# Patient Record
Sex: Female | Born: 1940 | Race: White | Hispanic: No | Smoking: Never smoker
Health system: Southern US, Community
[De-identification: ages and names within clinical notes are randomized; demographics above are authoritative.]

## PROBLEM LIST (undated history)

## (undated) DIAGNOSIS — F32A Depression, unspecified: Secondary | ICD-10-CM

## (undated) DIAGNOSIS — F329 Major depressive disorder, single episode, unspecified: Secondary | ICD-10-CM

## (undated) DIAGNOSIS — M797 Fibromyalgia: Secondary | ICD-10-CM

## (undated) DIAGNOSIS — M1990 Unspecified osteoarthritis, unspecified site: Secondary | ICD-10-CM

## (undated) DIAGNOSIS — C50919 Malignant neoplasm of unspecified site of unspecified female breast: Secondary | ICD-10-CM

## (undated) DIAGNOSIS — M545 Low back pain, unspecified: Secondary | ICD-10-CM

## (undated) DIAGNOSIS — G8929 Other chronic pain: Secondary | ICD-10-CM

## (undated) DIAGNOSIS — I10 Essential (primary) hypertension: Secondary | ICD-10-CM

## (undated) DIAGNOSIS — K219 Gastro-esophageal reflux disease without esophagitis: Secondary | ICD-10-CM

## (undated) DIAGNOSIS — Z8639 Personal history of other endocrine, nutritional and metabolic disease: Secondary | ICD-10-CM

## (undated) HISTORY — PX: HERNIA REPAIR: SHX51

## (undated) HISTORY — PX: FRACTURE SURGERY: SHX138

## (undated) HISTORY — PX: UMBILICAL HERNIA REPAIR: SHX196

## (undated) HISTORY — PX: TUBAL LIGATION: SHX77

## (undated) HISTORY — PX: CATARACT EXTRACTION W/ INTRAOCULAR LENS  IMPLANT, BILATERAL: SHX1307

## (undated) HISTORY — PX: DILATION AND CURETTAGE OF UTERUS: SHX78

---

## 1976-10-31 HISTORY — PX: VAGINAL HYSTERECTOMY: SUR661

## 2006-10-31 DIAGNOSIS — C50919 Malignant neoplasm of unspecified site of unspecified female breast: Secondary | ICD-10-CM

## 2006-10-31 HISTORY — DX: Malignant neoplasm of unspecified site of unspecified female breast: C50.919

## 2006-10-31 HISTORY — PX: BREAST SURGERY: SHX581

## 2006-10-31 HISTORY — PX: BREAST BIOPSY: SHX20

## 2006-10-31 HISTORY — PX: BREAST LUMPECTOMY: SHX2

## 2012-02-19 ENCOUNTER — Encounter (HOSPITAL_BASED_OUTPATIENT_CLINIC_OR_DEPARTMENT_OTHER): Payer: Self-pay | Admitting: *Deleted

## 2012-02-19 ENCOUNTER — Emergency Department (HOSPITAL_BASED_OUTPATIENT_CLINIC_OR_DEPARTMENT_OTHER)
Admission: EM | Admit: 2012-02-19 | Discharge: 2012-02-19 | Disposition: A | Discharge status: Home / Self Care | Payer: Medicare Other | Attending: Emergency Medicine | Admitting: Emergency Medicine

## 2012-02-19 ENCOUNTER — Emergency Department (HOSPITAL_BASED_OUTPATIENT_CLINIC_OR_DEPARTMENT_OTHER): Payer: Medicare Other

## 2012-02-19 ENCOUNTER — Emergency Department (INDEPENDENT_AMBULATORY_CARE_PROVIDER_SITE_OTHER): Payer: Medicare Other

## 2012-02-19 DIAGNOSIS — J45909 Unspecified asthma, uncomplicated: Secondary | ICD-10-CM

## 2012-02-19 DIAGNOSIS — I10 Essential (primary) hypertension: Secondary | ICD-10-CM | POA: Insufficient documentation

## 2012-02-19 DIAGNOSIS — R0602 Shortness of breath: Secondary | ICD-10-CM | POA: Insufficient documentation

## 2012-02-19 DIAGNOSIS — J329 Chronic sinusitis, unspecified: Secondary | ICD-10-CM

## 2012-02-19 DIAGNOSIS — R05 Cough: Secondary | ICD-10-CM

## 2012-02-19 DIAGNOSIS — R059 Cough, unspecified: Secondary | ICD-10-CM | POA: Insufficient documentation

## 2012-02-19 HISTORY — DX: Essential (primary) hypertension: I10

## 2012-02-19 MED ORDER — FLUTICASONE PROPIONATE HFA 44 MCG/ACT IN AERO
2.0000 | INHALATION_SPRAY | Freq: Two times a day (BID) | RESPIRATORY_TRACT | Status: DC
  Administered 2012-02-19: 2 via RESPIRATORY_TRACT
  Filled 2012-02-19: qty 10.6

## 2012-02-19 MED ORDER — CETIRIZINE HCL 10 MG PO TABS: ORAL_TABLET | ORAL | Status: AC

## 2012-02-19 MED ORDER — HYDROCOD POLST-CHLORPHEN POLST 10-8 MG/5ML PO LQCR: 5.0000 mL | Freq: Two times a day (BID) | ORAL | Status: DC | PRN

## 2012-02-19 NOTE — ED Provider Notes (Signed)
History   This chart was scribed for Hanley Seamen, MD scribed by Magnus Sinning. The patient was seen in room MH12/MH12 seen at 23:17    CSN: [id number]  Arrival date & time 02/19/12  2033   First MD Initiated Contact with Patient 02/19/12 2303      Chief Complaint  Patient presents with  . Cough    (Consider location/radiation/quality/duration/timing/severity/associated sxs/prior treatment) HPI Lindsey Lowe is a 71 y.o. female who presents to the Emergency Department complaining of constant moderate cough with associated cough induced CP, SOB, and head congestion, onset 3 weeks. Says she is not currently taking anything for allergies. Denies any aggravating or modifying factors.  Past Medical History  Diagnosis Date  . Hypertension     History reviewed. No pertinent past surgical history.  No family history on file.  History  Substance Use Topics  . Smoking status: Never Smoker   . Smokeless tobacco: Not on file  . Alcohol Use:    Review of Systems 10 Systems reviewed and are negative for acute change except as noted in the HPI. Allergies  Codeine; Gluten; Prednisone; and Wheat  Home Medications   Current Outpatient Rx  Name Route Sig Dispense Refill  . ALBUTEROL SULFATE HFA 108 (90 BASE) MCG/ACT IN AERS Inhalation Inhale 2 puffs into the lungs every 6 (six) hours as needed. For shortness of breath or wheezing    . GABAPENTIN 300 MG PO CAPS Oral Take 600 mg by mouth 2 (two) times daily.    Marland Kitchen LETROZOLE 2.5 MG PO TABS Oral Take 2.5 mg by mouth daily.    . ADULT MULTIVITAMIN W/MINERALS CH Oral Take 1 tablet by mouth daily.    Marland Kitchen NAPROXEN SODIUM 220 MG PO TABS Oral Take 220 mg by mouth 2 (two) times daily as needed. For pain    . PANTOPRAZOLE SODIUM 40 MG PO TBEC Oral Take 40 mg by mouth daily.    . TRIAMTERENE-HCTZ 37.5-25 MG PO TABS Oral Take 1 tablet by mouth daily.    Marland Kitchen CETIRIZINE HCL 10 MG PO TABS  Take 1 daily at bedtime as needed for allergies.    Marland Kitchen HYDROCOD  POLST-CPM POLST ER 10-8 MG/5ML PO LQCR Oral Take 5 mLs by mouth every 12 (twelve) hours as needed (cough). 115 mL 0    Physical Exam BP 184/88  Pulse 93  Temp(Src) 97.7 F (36.5 C) (Oral)  Resp 18  Ht 5' (1.524 m)  Wt 145 lb (65.772 kg)  BMI 28.32 kg/m2  SpO2 99%  General Appearance:    Alert, cooperative, no distress, appears stated age  Head:    Normocephalic, without obvious abnormality, atraumatic  Eyes:    PERRL, conjunctiva/corneas clear, EOM's intact, fundi    benign, both eyes  Ears:    Normal TM's and external ear canals, both ears  Nose:   Nares normal, septum midline, mucosa normal, no drainage    or sinus tenderness  Throat:   Lips, mucosa, and tongue normal; teeth and gums normal  Neck:   Supple, symmetrical, trachea midline, no adenopathy;    thyroid:  no enlargement/tenderness/nodules; no carotid   bruit or JVD  Back:     Symmetric, no curvature, ROM normal, no CVA tenderness  Lungs:     Clear to auscultation bilaterally, respirations unlabored.Mildly decreased air movement. No wheezing. Dry cough. Mild bilateral parasternal tenderness   Chest Wall:    No tenderness or deformity.    Heart:    Regular rate and rhythm,  S1 and S2 normal, no murmur, rub   or gallop  Breast Exam:     Abdomen:     Soft, non-tender, bowel sounds active all four quadrants,    no masses, no organomegaly  Genitalia:    Rectal:    Extremities:   Extremities normal, atraumatic, no cyanosis. Trace edema of lower legs.  Pulses:   2+ and symmetric all extremities  Skin:   Skin color, texture, turgor normal, no rashes or lesions  Lymph nodes:   Cervical, supraclavicular, and axillary nodes normal  Neurologic:   CNII-XII intact, normal strength, sensation and reflexes    throughout    ED Course  Procedures (including critical care time) DIAGNOSTIC STUDIES: Oxygen Saturation is 99% on room air, normal by my interpretation.    COORDINATION OF CARE: Dg Chest 2 View  02/19/2012  *RADIOLOGY  REPORT*  Clinical Data: Cough for 3 weeks.  Shortness of breath.  Sinus infection.  CHEST - 2 VIEW  Comparison: None.  Findings: Borderline heart size with normal pulmonary vascularity. Tortuous and calcified aorta.  Mild peribronchial thickening and interstitial change suggesting chronic bronchitis.  No focal airspace consolidation in the lungs.  No blunting of costophrenic angles.  No pneumothorax.  Degenerative changes in the thoracic spine.  IMPRESSION: Chronic bronchitic changes.  No focal consolidation.  Original Report Authenticated By: Marlon Pel, M.D.     1. Allergic bronchitis       MDM  The patient has an albuterol inhaler but does not have a spacer device. We will have our respiratory therapist provide a space device instructor at its proper use. We'll also start her on steroid inhaler  I personally performed the services described in this documentation, which was scribed in my presence.  The recorded information has been reviewed and considered.         Hanley Seamen, MD 02/19/12 2330

## 2012-02-19 NOTE — Patient Instructions (Signed)
Pt instructed on the proper use of administering flovent mdi via aerochamber. Pt tolerated well

## 2012-02-19 NOTE — ED Notes (Signed)
Coughing for appx 3 weeks. Went to the dr three weeks ago and got abx for a sinus infection. Now has continuing cough and feels SOB.

## 2012-02-20 NOTE — ED Notes (Signed)
[facility] pharmacy called requesting quantity for zyrtec prescription. PA Lawyer approved #[id number].

## 2014-04-09 ENCOUNTER — Emergency Department
Admission: EM | Admit: 2014-04-09 | Discharge: 2014-04-09 | Disposition: A | Discharge status: Home / Self Care | Payer: 59 | Source: Home / Self Care

## 2014-04-09 ENCOUNTER — Encounter: Payer: Self-pay | Admitting: Emergency Medicine

## 2014-04-09 DIAGNOSIS — B9789 Other viral agents as the cause of diseases classified elsewhere: Principal | ICD-10-CM

## 2014-04-09 DIAGNOSIS — J069 Acute upper respiratory infection, unspecified: Secondary | ICD-10-CM

## 2014-04-09 MED ORDER — BENZONATATE 200 MG PO CAPS: 200.0000 mg | ORAL_CAPSULE | Freq: Every day | ORAL | Status: DC

## 2014-04-09 MED ORDER — AMOXICILLIN 875 MG PO TABS: 875.0000 mg | ORAL_TABLET | Freq: Two times a day (BID) | ORAL | Status: DC

## 2014-04-09 NOTE — Discharge Instructions (Signed)
Take plain Mucinex (1200 mg guaifenesin) twice daily for cough and congestion.  Increase fluid intake, rest. May use Afrin nasal spray (or generic oxymetazoline) twice daily for about 5 days.  Also recommend using saline nasal spray several times daily and saline nasal irrigation (AYR is a common brand).  Continue Flonase nasal spray. Try warm salt water gargles for sore throat.  Stop all antihistamines for now, and other non-prescription cough/cold preparations. May take Ibuprofen 200mg , 4 tabs every 8 hours with food for chest/sternum discomfort. Begin amoxicillin if not improving about 5 days or if persistent fever develops  Follow-up with family doctor if not improving in about 7 to10 days.

## 2014-04-09 NOTE — ED Provider Notes (Signed)
CSN: [id number]     Arrival date & time 04/09/14  1522 History   None    Chief Complaint  Patient presents with  . Cough      HPI Comments: Patient developed onset of URI symptoms one week ago with sinus congestion.  She started taking Allegra at that time without improvement.  Two days ago she developed sore throat, productive cough, fatigue, myalgias, and sweats.  She complains of tightness in her anterior chest.  The history is provided by the patient.    Past Medical History  Diagnosis Date  . Hypertension    History reviewed. No pertinent past surgical history. No family history on file. History  Substance Use Topics  . Smoking status: Never Smoker   . Smokeless tobacco: Not on file  . Alcohol Use: No   OB History   Grav Para Term Preterm Abortions TAB SAB Ect Mult Living                 Review of Systems + sore throat + cough No pleuritic pain, but has tightness in anterior chest ? wheezing + nasal congestion + post-nasal drainage ? sinus pain/pressure No itchy/red eyes No earache No hemoptysis No SOB No fever, + chills/sweats No nausea No vomiting No abdominal pain No diarrhea No urinary symptoms No skin rash + fatigue + myalgias No headache Used OTC meds without relief  Allergies  Codeine; Gluten; Prednisone; and Wheat  Home Medications   Prior to Admission medications   Medication Sig Start Date End Date Taking? Authorizing Provider  albuterol (PROVENTIL HFA;VENTOLIN HFA) 108 (90 BASE) MCG/ACT inhaler Inhale 2 puffs into the lungs every 6 (six) hours as needed. For shortness of breath or wheezing    Historical Provider, MD  amoxicillin (AMOXIL) 875 MG tablet Take 1 tablet (875 mg total) by mouth 2 (two) times daily. (Rx void after 04/16/14) 04/09/14   [name], MD  benzonatate (TESSALON) 200 MG capsule Take 1 capsule (200 mg total) by mouth at bedtime. Take as needed for cough 04/09/14   [name], MD  cetirizine (ZYRTEC) 10 MG tablet  Take 1 daily at bedtime as needed for allergies. 02/19/12   John L Molpus, MD  gabapentin (NEURONTIN) 300 MG capsule Take 600 mg by mouth 2 (two) times daily.    Historical Provider, MD  letrozole (FEMARA) 2.5 MG tablet Take 2.5 mg by mouth daily.    Historical Provider, MD  Multiple Vitamin (MULITIVITAMIN WITH MINERALS) TABS Take 1 tablet by mouth daily.    Historical Provider, MD  naproxen sodium (ANAPROX) 220 MG tablet Take 220 mg by mouth 2 (two) times daily as needed. For pain    Historical Provider, MD  pantoprazole (PROTONIX) 40 MG tablet Take 40 mg by mouth daily.    Historical Provider, MD  triamterene-hydrochlorothiazide (MAXZIDE-25) 37.5-25 MG per tablet Take 1 tablet by mouth daily.    Historical Provider, MD   Pulse 84  Temp(Src) 97.9 F (36.6 C) (Oral)  Ht 5' (1.524 m)  Wt 152 lb (68.947 kg)  BMI 29.69 kg/m2  SpO2 98% Physical Exam Nursing notes and Vital Signs reviewed. Appearance:  Patient appears healthy, stated age, and in no acute distress Eyes:  Pupils are equal, round, and reactive to light and accomodation.  Extraocular movement is intact.  Conjunctivae are not inflamed  Ears:  Canals normal.  Tympanic membranes normal.  Nose:  Mildly congested turbinates.  No sinus tenderness.   Pharynx:  Normal Neck:  Supple.  Tender enlarged posterior nodes are palpated bilaterally  Lungs:  Clear to auscultation.  Breath sounds are equal.  Chest:  Distinct tenderness to palpation over the mid-sternum.  Heart:  Regular rate and rhythm without murmurs, rubs, or gallops.  Abdomen:  Nontender without masses or hepatosplenomegaly.  Bowel sounds are present.  No CVA or flank tenderness.  Extremities:  No edema.  No calf tenderness Skin:  No rash present.   ED Course  Procedures  none      MDM   1. Viral URI with cough    There is no evidence of bacterial infection today.   Treat symptomatically for now:  Prescription written for Benzonatate [facility]) to take at bedtime for  night-time cough.  Take plain Mucinex (1200 mg guaifenesin) twice daily for cough and congestion.  Increase fluid intake, rest. May use Afrin nasal spray (or generic oxymetazoline) twice daily for about 5 days.  Also recommend using saline nasal spray several times daily and saline nasal irrigation (AYR is a common brand).  Continue Flonase nasal spray. Try warm salt water gargles for sore throat.  Stop all antihistamines for now, and other non-prescription cough/cold preparations. May take Ibuprofen 200mg , 4 tabs every 8 hours with food for chest/sternum discomfort. Begin amoxicillin if not improving about 5 days or if persistent fever develops (Given a prescription to hold, with an expiration date)  Follow-up with family doctor if not improving in about 7 to10 days.    [name], MD 04/09/14 [phone]

## 2014-04-09 NOTE — ED Notes (Signed)
Productive cough, congestion, dark yellow drainage x 1 week worse last three days

## 2014-04-12 ENCOUNTER — Telehealth: Payer: Self-pay | Admitting: *Deleted

## 2014-10-31 HISTORY — PX: NASAL SINUS SURGERY: SHX719

## 2015-11-01 HISTORY — PX: NASAL FRACTURE SURGERY: SHX718

## 2016-11-01 ENCOUNTER — Encounter (HOSPITAL_BASED_OUTPATIENT_CLINIC_OR_DEPARTMENT_OTHER): Payer: Self-pay | Admitting: *Deleted

## 2016-11-01 ENCOUNTER — Emergency Department (HOSPITAL_BASED_OUTPATIENT_CLINIC_OR_DEPARTMENT_OTHER): Payer: Medicare HMO

## 2016-11-01 ENCOUNTER — Observation Stay (HOSPITAL_BASED_OUTPATIENT_CLINIC_OR_DEPARTMENT_OTHER)
Admission: EM | Admit: 2016-11-01 | Discharge: 2016-11-02 | Disposition: A | Discharge status: Home / Self Care | Payer: Medicare HMO | Attending: Internal Medicine | Admitting: Internal Medicine

## 2016-11-01 DIAGNOSIS — Z853 Personal history of malignant neoplasm of breast: Secondary | ICD-10-CM | POA: Diagnosis not present

## 2016-11-01 DIAGNOSIS — Z79899 Other long term (current) drug therapy: Secondary | ICD-10-CM | POA: Diagnosis not present

## 2016-11-01 DIAGNOSIS — B9789 Other viral agents as the cause of diseases classified elsewhere: Secondary | ICD-10-CM | POA: Insufficient documentation

## 2016-11-01 DIAGNOSIS — J102 Influenza due to other identified influenza virus with gastrointestinal manifestations: Secondary | ICD-10-CM | POA: Diagnosis not present

## 2016-11-01 DIAGNOSIS — R0902 Hypoxemia: Secondary | ICD-10-CM

## 2016-11-01 DIAGNOSIS — Z79811 Long term (current) use of aromatase inhibitors: Secondary | ICD-10-CM | POA: Insufficient documentation

## 2016-11-01 DIAGNOSIS — I10 Essential (primary) hypertension: Secondary | ICD-10-CM | POA: Insufficient documentation

## 2016-11-01 DIAGNOSIS — R05 Cough: Secondary | ICD-10-CM | POA: Diagnosis present

## 2016-11-01 DIAGNOSIS — M797 Fibromyalgia: Secondary | ICD-10-CM | POA: Diagnosis not present

## 2016-11-01 DIAGNOSIS — F329 Major depressive disorder, single episode, unspecified: Secondary | ICD-10-CM | POA: Insufficient documentation

## 2016-11-01 DIAGNOSIS — K219 Gastro-esophageal reflux disease without esophagitis: Secondary | ICD-10-CM | POA: Insufficient documentation

## 2016-11-01 DIAGNOSIS — E876 Hypokalemia: Secondary | ICD-10-CM | POA: Diagnosis not present

## 2016-11-01 DIAGNOSIS — R059 Cough, unspecified: Secondary | ICD-10-CM

## 2016-11-01 DIAGNOSIS — N390 Urinary tract infection, site not specified: Secondary | ICD-10-CM | POA: Diagnosis not present

## 2016-11-01 DIAGNOSIS — K900 Celiac disease: Secondary | ICD-10-CM | POA: Diagnosis not present

## 2016-11-01 DIAGNOSIS — R6510 Systemic inflammatory response syndrome (SIRS) of non-infectious origin without acute organ dysfunction: Secondary | ICD-10-CM | POA: Diagnosis not present

## 2016-11-01 DIAGNOSIS — J101 Influenza due to other identified influenza virus with other respiratory manifestations: Secondary | ICD-10-CM | POA: Diagnosis present

## 2016-11-01 HISTORY — DX: Low back pain: M54.5

## 2016-11-01 HISTORY — DX: Personal history of other endocrine, nutritional and metabolic disease: Z86.39

## 2016-11-01 HISTORY — DX: Depression, unspecified: F32.A

## 2016-11-01 HISTORY — DX: Unspecified osteoarthritis, unspecified site: M19.90

## 2016-11-01 HISTORY — DX: Fibromyalgia: M79.7

## 2016-11-01 HISTORY — DX: Gastro-esophageal reflux disease without esophagitis: K21.9

## 2016-11-01 HISTORY — DX: Other chronic pain: G89.29

## 2016-11-01 HISTORY — DX: Low back pain, unspecified: M54.50

## 2016-11-01 HISTORY — DX: Major depressive disorder, single episode, unspecified: F32.9

## 2016-11-01 HISTORY — DX: Malignant neoplasm of unspecified site of unspecified female breast: C50.919

## 2016-11-01 LAB — COMPREHENSIVE METABOLIC PANEL
ALK PHOS: 70 U/L (ref 38–126)
ALT: 15 U/L (ref 14–54)
AST: 24 U/L (ref 15–41)
Albumin: 4 g/dL (ref 3.5–5.0)
Anion gap: 10 (ref 5–15)
BILIRUBIN TOTAL: 0.6 mg/dL (ref 0.3–1.2)
BUN: 10 mg/dL (ref 6–20)
CALCIUM: 8.9 mg/dL (ref 8.9–10.3)
CO2: 26 mmol/L (ref 22–32)
CREATININE: 0.89 mg/dL (ref 0.44–1.00)
Chloride: 96 mmol/L — ABNORMAL LOW (ref 101–111)
Glucose, Bld: 108 mg/dL — ABNORMAL HIGH (ref 65–99)
Potassium: 3.3 mmol/L — ABNORMAL LOW (ref 3.5–5.1)
Sodium: 132 mmol/L — ABNORMAL LOW (ref 135–145)
TOTAL PROTEIN: 7.6 g/dL (ref 6.5–8.1)

## 2016-11-01 LAB — CBC WITH DIFFERENTIAL/PLATELET
BASOS PCT: 0 %
Basophils Absolute: 0 10*3/uL (ref 0.0–0.1)
EOS ABS: 0 10*3/uL (ref 0.0–0.7)
EOS PCT: 0 %
HCT: 40 % (ref 36.0–46.0)
Hemoglobin: 12.8 g/dL (ref 12.0–15.0)
Lymphocytes Relative: 9 %
Lymphs Abs: 0.5 10*3/uL — ABNORMAL LOW (ref 0.7–4.0)
MCH: 28.4 pg (ref 26.0–34.0)
MCHC: 32 g/dL (ref 30.0–36.0)
MCV: 88.7 fL (ref 78.0–100.0)
Monocytes Absolute: 0.6 10*3/uL (ref 0.1–1.0)
Monocytes Relative: 11 %
Neutro Abs: 4 10*3/uL (ref 1.7–7.7)
Neutrophils Relative %: 80 %
PLATELETS: 303 10*3/uL (ref 150–400)
RBC: 4.51 MIL/uL (ref 3.87–5.11)
RDW: 13.8 % (ref 11.5–15.5)
WBC: 5 10*3/uL (ref 4.0–10.5)

## 2016-11-01 LAB — URINALYSIS, ROUTINE W REFLEX MICROSCOPIC
Bilirubin Urine: NEGATIVE
Glucose, UA: NEGATIVE mg/dL
HGB URINE DIPSTICK: NEGATIVE
KETONES UR: NEGATIVE mg/dL
Nitrite: POSITIVE — AB
PROTEIN: NEGATIVE mg/dL
Specific Gravity, Urine: 1.013 (ref 1.005–1.030)
pH: 7 (ref 5.0–8.0)

## 2016-11-01 LAB — URINALYSIS, MICROSCOPIC (REFLEX)

## 2016-11-01 LAB — I-STAT CG4 LACTIC ACID, ED: LACTIC ACID, VENOUS: 0.93 mmol/L (ref 0.5–1.9)

## 2016-11-01 MED ORDER — DEXTROSE 5 % IV SOLN
1.0000 g | Freq: Once | INTRAVENOUS | Status: AC
  Administered 2016-11-01: 1 g via INTRAVENOUS
  Filled 2016-11-01: qty 10

## 2016-11-01 MED ORDER — ACETAMINOPHEN 325 MG PO TABS
650.0000 mg | ORAL_TABLET | Freq: Once | ORAL | Status: AC
  Administered 2016-11-01: 650 mg via ORAL
  Filled 2016-11-01: qty 2

## 2016-11-01 MED ORDER — SODIUM CHLORIDE 0.9 % IV BOLUS (SEPSIS)
1000.0000 mL | Freq: Once | INTRAVENOUS | Status: AC
  Administered 2016-11-01: 1000 mL via INTRAVENOUS

## 2016-11-01 NOTE — ED Notes (Signed)
Talked to EDP prior to starting abx about possibility of needing blood cultures. EDP did not believe that cultures are needed for this pt.

## 2016-11-01 NOTE — ED Provider Notes (Signed)
[facility] DEPT [facility] Provider Note   CSN: [id number] Arrival date & time: 11/01/16  [id number]   By signing my name below, I, Lindsey Lowe, attest that this documentation has been prepared under the direction and in the presence of [name] Julio Alm, MD  Electronically Signed: [name], ED Scribe. 11/01/16. 7:13 PM.   History   Chief Complaint Chief Complaint  Patient presents with  . Cough   The history is provided by the patient. No language interpreter was used.   HPI Comments:  Lindsey Lowe is a 76 y.o. female, with a hx of celiac disease, who presents to the Emergency Department complaining of a sudden onset, persistent cough which began in the PM yesterday. Pt also reports subjective fever, congestion and a sore throat. Pt states she experienced LOC and fell 3 days ago. Relative reports when the pt came to she did not know where she was, crawled to the bathroom, vomited and had diarrhea. No alleviating factors noted. Pt denies any other associated symptoms and any other modifying factors at this time.   Past Medical History:  Diagnosis Date  . Cancer ([place])   . Hypertension     There are no active problems to display for this patient.   Past Surgical History:  Procedure Laterality Date  . ABDOMINAL HYSTERECTOMY    . BREAST SURGERY    . EYE SURGERY    . HERNIA REPAIR    . NASAL SINUS SURGERY      OB History    No data available       Home Medications    Prior to Admission medications   Medication Sig Start Date End Date Taking? Authorizing Provider  albuterol (PROVENTIL HFA;VENTOLIN HFA) 108 (90 BASE) MCG/ACT inhaler Inhale 2 puffs into the lungs every 6 (six) hours as needed. For shortness of breath or wheezing   Yes Historical Provider, MD  gabapentin (NEURONTIN) 300 MG capsule Take 600 mg by mouth 2 (two) times daily.   Yes Historical Provider, MD  letrozole (FEMARA) 2.5 MG tablet Take 2.5 mg by mouth daily.   Yes Historical Provider, MD  Multiple  Vitamin (MULITIVITAMIN WITH MINERALS) TABS Take 1 tablet by mouth daily.   Yes Historical Provider, MD  pantoprazole (PROTONIX) 40 MG tablet Take 40 mg by mouth daily.   Yes Historical Provider, MD  triamterene-hydrochlorothiazide (MAXZIDE-25) 37.5-25 MG per tablet Take 1 tablet by mouth daily.   Yes Historical Provider, MD  amoxicillin (AMOXIL) 875 MG tablet Take 1 tablet (875 mg total) by mouth 2 (two) times daily. (Rx void after 04/16/14) 04/09/14   [name], MD  benzonatate (TESSALON) 200 MG capsule Take 1 capsule (200 mg total) by mouth at bedtime. Take as needed for cough 04/09/14   [name], MD  cetirizine (ZYRTEC) 10 MG tablet Take 1 daily at bedtime as needed for allergies. 02/19/12   [name], MD  naproxen sodium (ANAPROX) 220 MG tablet Take 220 mg by mouth 2 (two) times daily as needed. For pain    Historical Provider, MD    Family History No family history on file.  Social History Social History  Substance Use Topics  . Smoking status: Never Smoker  . Smokeless tobacco: Never Used  . Alcohol use No     Allergies   Codeine; Gluten; Prednisone; and Wheat   Review of Systems Review of Systems  HENT: Positive for congestion and sore throat.   Respiratory: Positive for cough.   Gastrointestinal: Positive for diarrhea and  vomiting.  Neurological: Positive for syncope.  All other systems reviewed and are negative.  Physical Exam Updated Vital Signs BP 142/68   Pulse 91   Temp 98.4 F (36.9 C) (Oral)   Resp 16   Ht 4\' 11"  (1.499 m)   Wt 152 lb (68.9 kg)   SpO2 94%   BMI 30.70 kg/m   Physical Exam  Constitutional: She is oriented to person, place, and time. She appears well-developed and well-nourished. No distress.  HENT:  Head: Normocephalic and atraumatic.  Eyes: Conjunctivae are normal.  Cardiovascular: Tachycardia present.   Pulmonary/Chest: Effort normal. Tachypnea (mildly ) noted. She has no wheezes.  Abdominal: She exhibits no distension.    Neurological: She is alert and oriented to person, place, and time.  Skin: Skin is warm and dry.  Psychiatric: She has a normal mood and affect.  Nursing note and vitals reviewed.  ED Treatments / Results  DIAGNOSTIC STUDIES:  Oxygen Saturation is 100% on Ra, normal by my interpretation.    COORDINATION OF CARE:  7:12 PM Strep screen and lab work. Discussed treatment plan with pt at bedside and pt agreed to plan.  Labs (all labs ordered are listed, but only abnormal results are displayed) Labs Reviewed  COMPREHENSIVE METABOLIC PANEL - Abnormal; Notable for the following:       Result Value   Sodium 132 (*)    Potassium 3.3 (*)    Chloride 96 (*)    Glucose, Bld 108 (*)    All other components within normal limits  CBC WITH DIFFERENTIAL/PLATELET - Abnormal; Notable for the following:    Lymphs Abs 0.5 (*)    All other components within normal limits  URINALYSIS, ROUTINE W REFLEX MICROSCOPIC - Abnormal; Notable for the following:    APPearance CLOUDY (*)    Nitrite POSITIVE (*)    Leukocytes, UA MODERATE (*)    All other components within normal limits  URINALYSIS, MICROSCOPIC (REFLEX) - Abnormal; Notable for the following:    Bacteria, UA MANY (*)    Squamous Epithelial / LPF 0-5 (*)    All other components within normal limits  URINE CULTURE  I-STAT CG4 LACTIC ACID, ED    EKG  EKG Interpretation  Date/Time:  Tuesday November 01 2016 20:14:55 EST Ventricular Rate:  91 PR Interval:    QRS Duration: 105 QT Interval:  380 QTC Calculation: 468 R Axis:   25 Text Interpretation:  Sinus rhythm Left ventricular hypertrophy Baseline wander in lead(s) V6 Normal sinus rhythm Confirmed by [name] ([id number]) on 11/01/2016 9:00:13 PM       Radiology Dg Chest 2 View  Result Date: 11/01/2016 CLINICAL DATA:  76 year old female with cough for 4 days. Initial encounter. EXAM: CHEST  2 VIEW COMPARISON:  [facility] 07/20/2015 FINDINGS: Stable lung volumes. Mediastinal  contours remain within normal limits. No pneumothorax, pulmonary edema, pleural effusion or confluent pulmonary opacity. Stable mild eventration of the diaphragm. Osteopenia with mildly exaggerated thoracic kyphosis. Stable visualized osseous structures. Visible bowel gas pattern is within normal limits. IMPRESSION: No acute cardiopulmonary abnormality. Electronically Signed   By: Genevie Ann M.D.   On: 11/01/2016 16:25    Procedures Procedures (including critical care time)  Medications Ordered in ED Medications  cefTRIAXone (ROCEPHIN) 1 g in dextrose 5 % 50 mL IVPB (not administered)  sodium chloride 0.9 % bolus 1,000 mL (1,000 mLs Intravenous New Bag/Given 11/01/16 2009)  acetaminophen (TYLENOL) tablet 650 mg (650 mg Oral Given 11/01/16 1955)  Initial Impression / Assessment and Plan / ED Course  I have reviewed the triage vital signs and the nursing notes.  Pertinent labs & imaging results that were available during my care of the patient were reviewed by me and considered in my medical decision making (see chart for details).  Clinical Course     Patient is a very pleasant 76 year old female presenting with cough. Patient has been feeling unwell for several days. Patient has syncopal event on Saturday with nausea and vomiting and diarrhea. Patient's reports unable to take much by mouth since then. Today started with worsening cough. Chest x-ray here negative. However patient mildly tachycardic,and febrile. Concern for flu.  We'll give fluids, get labs, UA.  Pt may require admission given vital sign bnormalities and age.  Patient requiring oxygen to keep her oxygen saturation above 90. Patient additionallty has UTI. We'll give ceftriaxone. We'll run flu.    Final Clinical Impressions(s) / ED Diagnoses   Final diagnoses:  None    New Prescriptions New Prescriptions   No medications on file  I personally performed the services described in this documentation, which was scribed  in my presence. The recorded information has been reviewed and is accurate.      Siyon Linck Julio Alm, MD 11/01/16 2101

## 2016-11-01 NOTE — Progress Notes (Signed)
Accepted to [facility] med-surg bed under obs status from [facility] for fevers, malaise, and hypoxia suspected secondary to viral respiratory illness. Oxygenating adequately and comfortable with 2 Lpm via nasal canula, but desats without. Given Rocephin for UA suggestive of infection. Flu swab sent.

## 2016-11-02 DIAGNOSIS — R6510 Systemic inflammatory response syndrome (SIRS) of non-infectious origin without acute organ dysfunction: Secondary | ICD-10-CM | POA: Diagnosis not present

## 2016-11-02 DIAGNOSIS — J101 Influenza due to other identified influenza virus with other respiratory manifestations: Secondary | ICD-10-CM | POA: Diagnosis not present

## 2016-11-02 DIAGNOSIS — E876 Hypokalemia: Secondary | ICD-10-CM | POA: Diagnosis present

## 2016-11-02 DIAGNOSIS — N390 Urinary tract infection, site not specified: Secondary | ICD-10-CM | POA: Diagnosis present

## 2016-11-02 DIAGNOSIS — N3000 Acute cystitis without hematuria: Secondary | ICD-10-CM

## 2016-11-02 DIAGNOSIS — K900 Celiac disease: Secondary | ICD-10-CM

## 2016-11-02 LAB — RESPIRATORY PANEL BY PCR
Adenovirus: NOT DETECTED
BORDETELLA PERTUSSIS-RVPCR: NOT DETECTED
CHLAMYDOPHILA PNEUMONIAE-RVPPCR: NOT DETECTED
CORONAVIRUS HKU1-RVPPCR: NOT DETECTED
Coronavirus 229E: NOT DETECTED
Coronavirus NL63: NOT DETECTED
Coronavirus OC43: NOT DETECTED
INFLUENZA A H3-RVPPCR: DETECTED — AB
INFLUENZA B-RVPPCR: NOT DETECTED
METAPNEUMOVIRUS-RVPPCR: NOT DETECTED
Mycoplasma pneumoniae: NOT DETECTED
PARAINFLUENZA VIRUS 2-RVPPCR: NOT DETECTED
PARAINFLUENZA VIRUS 3-RVPPCR: NOT DETECTED
PARAINFLUENZA VIRUS 4-RVPPCR: NOT DETECTED
Parainfluenza Virus 1: NOT DETECTED
RESPIRATORY SYNCYTIAL VIRUS-RVPPCR: NOT DETECTED
RHINOVIRUS / ENTEROVIRUS - RVPPCR: NOT DETECTED

## 2016-11-02 LAB — CBC
HEMATOCRIT: 37.3 % (ref 36.0–46.0)
Hemoglobin: 12.2 g/dL (ref 12.0–15.0)
MCH: 28.4 pg (ref 26.0–34.0)
MCHC: 32.7 g/dL (ref 30.0–36.0)
MCV: 86.9 fL (ref 78.0–100.0)
Platelets: 274 10*3/uL (ref 150–400)
RBC: 4.29 MIL/uL (ref 3.87–5.11)
RDW: 13.8 % (ref 11.5–15.5)
WBC: 3.3 10*3/uL — AB (ref 4.0–10.5)

## 2016-11-02 LAB — INFLUENZA PANEL BY PCR (TYPE A & B)
INFLBPCR: NEGATIVE
Influenza A By PCR: POSITIVE — AB

## 2016-11-02 LAB — BASIC METABOLIC PANEL
ANION GAP: 12 (ref 5–15)
BUN: 8 mg/dL (ref 6–20)
CO2: 24 mmol/L (ref 22–32)
Calcium: 8.6 mg/dL — ABNORMAL LOW (ref 8.9–10.3)
Chloride: 100 mmol/L — ABNORMAL LOW (ref 101–111)
Creatinine, Ser: 0.91 mg/dL (ref 0.44–1.00)
GFR calc Af Amer: >60 mL/min (ref >60)
GFR, EST NON AFRICAN AMERICAN: 59 mL/min — AB (ref >60)
GLUCOSE: 102 mg/dL — AB (ref 65–99)
POTASSIUM: 3.4 mmol/L — AB (ref 3.5–5.1)
Sodium: 136 mmol/L (ref 135–145)

## 2016-11-02 LAB — LACTIC ACID, PLASMA
Lactic Acid, Venous: 0.8 mmol/L (ref 0.5–1.9)
Lactic Acid, Venous: 0.6 mmol/L (ref 0.5–1.9)

## 2016-11-02 LAB — APTT: aPTT: 37 seconds — ABNORMAL HIGH (ref 24–36)

## 2016-11-02 LAB — PROTIME-INR
INR: 1.04
Prothrombin Time: 13.6 seconds (ref 11.4–15.2)

## 2016-11-02 LAB — PROCALCITONIN: Procalcitonin: 0.53 ng/mL

## 2016-11-02 MED ORDER — ACETAMINOPHEN 650 MG RE SUPP: 650.0000 mg | Freq: Four times a day (QID) | RECTAL | Status: DC | PRN

## 2016-11-02 MED ORDER — MENTHOL 3 MG MT LOZG: 1.0000 | LOZENGE | OROMUCOSAL | Status: DC | PRN

## 2016-11-02 MED ORDER — ONDANSETRON HCL 4 MG/2ML IJ SOLN: 4.0000 mg | Freq: Four times a day (QID) | INTRAMUSCULAR | Status: DC | PRN

## 2016-11-02 MED ORDER — PANTOPRAZOLE SODIUM 40 MG PO TBEC
40.0000 mg | DELAYED_RELEASE_TABLET | Freq: Every day | ORAL | Status: DC
  Administered 2016-11-02: 40 mg via ORAL
  Filled 2016-11-02: qty 1

## 2016-11-02 MED ORDER — CEFTRIAXONE SODIUM 1 G IJ SOLR
1.0000 g | INTRAMUSCULAR | Status: DC
  Filled 2016-11-02: qty 10

## 2016-11-02 MED ORDER — ONDANSETRON HCL 4 MG PO TABS: 4.0000 mg | ORAL_TABLET | Freq: Four times a day (QID) | ORAL | Status: DC | PRN

## 2016-11-02 MED ORDER — OSELTAMIVIR PHOSPHATE 75 MG PO CAPS: 75.0000 mg | ORAL_CAPSULE | Freq: Two times a day (BID) | ORAL | 0 refills | Status: AC

## 2016-11-02 MED ORDER — ADULT MULTIVITAMIN W/MINERALS CH
1.0000 | ORAL_TABLET | Freq: Every day | ORAL | Status: DC
  Administered 2016-11-02: 1 via ORAL
  Filled 2016-11-02: qty 1

## 2016-11-02 MED ORDER — SODIUM CHLORIDE 0.9 % IV SOLN
Freq: Once | INTRAVENOUS | Status: AC
  Administered 2016-11-02: 04:00:00 via INTRAVENOUS

## 2016-11-02 MED ORDER — SULFAMETHOXAZOLE-TRIMETHOPRIM 800-160 MG PO TABS: 1.0000 | ORAL_TABLET | Freq: Two times a day (BID) | ORAL | 0 refills | Status: AC

## 2016-11-02 MED ORDER — LETROZOLE 2.5 MG PO TABS
2.5000 mg | ORAL_TABLET | Freq: Every day | ORAL | Status: DC
  Administered 2016-11-02: 2.5 mg via ORAL
  Filled 2016-11-02: qty 1

## 2016-11-02 MED ORDER — GUAIFENESIN ER 600 MG PO TB12: 600.0000 mg | ORAL_TABLET | Freq: Two times a day (BID) | ORAL | 0 refills | Status: DC

## 2016-11-02 MED ORDER — DEXTROSE 5 % IV SOLN
500.0000 mg | Freq: Once | INTRAVENOUS | Status: DC
  Filled 2016-11-02: qty 500

## 2016-11-02 MED ORDER — BENZONATATE 100 MG PO CAPS
200.0000 mg | ORAL_CAPSULE | Freq: Four times a day (QID) | ORAL | Status: DC | PRN
  Administered 2016-11-02: 200 mg via ORAL
  Filled 2016-11-02: qty 2

## 2016-11-02 MED ORDER — DIPHENOXYLATE-ATROPINE 2.5-0.025 MG PO TABS: 1.0000 | ORAL_TABLET | Freq: Four times a day (QID) | ORAL | 0 refills | Status: AC | PRN

## 2016-11-02 MED ORDER — DIPHENOXYLATE-ATROPINE 2.5-0.025 MG PO TABS
1.0000 | ORAL_TABLET | Freq: Four times a day (QID) | ORAL | Status: DC | PRN
  Administered 2016-11-02 (×2): 1 via ORAL
  Filled 2016-11-02 (×2): qty 1

## 2016-11-02 MED ORDER — GUAIFENESIN ER 600 MG PO TB12
600.0000 mg | ORAL_TABLET | Freq: Two times a day (BID) | ORAL | Status: DC
  Administered 2016-11-02 (×2): 600 mg via ORAL
  Filled 2016-11-02 (×2): qty 1

## 2016-11-02 MED ORDER — GABAPENTIN 300 MG PO CAPS
600.0000 mg | ORAL_CAPSULE | Freq: Two times a day (BID) | ORAL | Status: DC
  Administered 2016-11-02 (×2): 600 mg via ORAL
  Filled 2016-11-02 (×2): qty 2

## 2016-11-02 MED ORDER — ACETAMINOPHEN 325 MG PO TABS: 650.0000 mg | ORAL_TABLET | Freq: Four times a day (QID) | ORAL | Status: DC | PRN

## 2016-11-02 MED ORDER — POTASSIUM CHLORIDE CRYS ER 20 MEQ PO TBCR
40.0000 meq | EXTENDED_RELEASE_TABLET | ORAL | Status: AC
  Administered 2016-11-02: 40 meq via ORAL
  Filled 2016-11-02: qty 2

## 2016-11-02 MED ORDER — SODIUM CHLORIDE 0.9 % IV SOLN: INTRAVENOUS | Status: DC

## 2016-11-02 MED ORDER — ALBUTEROL SULFATE (2.5 MG/3ML) 0.083% IN NEBU: 2.5000 mg | INHALATION_SOLUTION | RESPIRATORY_TRACT | Status: DC | PRN

## 2016-11-02 MED ORDER — OSELTAMIVIR PHOSPHATE 30 MG PO CAPS
30.0000 mg | ORAL_CAPSULE | Freq: Two times a day (BID) | ORAL | Status: DC
  Administered 2016-11-02: 30 mg via ORAL
  Filled 2016-11-02 (×2): qty 1

## 2016-11-02 MED ORDER — ENOXAPARIN SODIUM 40 MG/0.4ML ~~LOC~~ SOLN: 40.0000 mg | SUBCUTANEOUS | Status: DC

## 2016-11-02 NOTE — Progress Notes (Signed)
Pt placement and MD were reminded of the pt arrival on the floor.

## 2016-11-02 NOTE — Progress Notes (Signed)
Pt arrived floor. A and O x4. Settled in the room , pt placement called and informed about pt's arrival. Will continue to monitor.

## 2016-11-02 NOTE — H&P (Signed)
History and Physical    Lindsey Lowe [id number] DOB: 1941-05-19 DOA: 11/01/2016  Referring MD/NP/PA: Dr. Jeronimo Greaves PCP: No primary care provider on file.  Patient coming from: [facility] transfer  Chief Complaint: Cough  HPI: Lindsey Lowe is a 76 y.o. female with medical history significant of HTN and breast cancer; who presents with complaints of cough and shortness of breath. She has not been feeling well for the last 4 days. She initially reported nausea vomiting and diarrhea and had episode where she passed out briefly lost consciousness for  a brief period in time. She did not go to the emergency department for further evaluation. Thereafter she noted onset of nausea, vomiting, and diarrhea. She denies any knowledge of any recent sick contacts, but she had recently visited did nursing home multiple times to visit a friend the last few weeks. She reports that symptoms of nausea vomiting self resolve the following day. However, 3 days ago she developed a productive cough with sputum production. Patient reports that she was coughing so frequently that she developed chest soreness. for Associated symptoms include shortness of breath, decreased oral intake, headache, subjective fever as her thermometer would not work, and muscle aches. She tried taking over-the-counter medications without relief of symptoms. Family gives additional history that the patient was noted to have some stomach virus a week of Christmas that self resolved. Patient notes that she continues to have diarrhea.   ED Course: On admission to the emergency department patient was evaluated and seen have a temperature up to 101.71F, O2 saturations documented as low as 88% for which patient was placed on 2 L nasal cannula oxygen, and all other vitals relatively within normal limits. CBC was within normal limits, sodium 131, potassium 3.3, chloride 96, lactic acid 0.93. Chest x-ray showed no acute abnormalities.  Review of Systems: As per HPI  otherwise 10 point review of systems negative.   Past Medical History:  Diagnosis Date  . Arthritis    "all over" (11/01/2016)  . Breast cancer in female [facility]) 2008   left  . Chronic lower back pain   . Depression   . Fibromyalgia   . GERD (gastroesophageal reflux disease)   . History of high cholesterol   . Hypertension     Past Surgical History:  Procedure Laterality Date  . BREAST BIOPSY Left 2008  . BREAST LUMPECTOMY Left 2008  . BREAST SURGERY Left 2008   "went in & looked around to be sure they got it all"  . CATARACT EXTRACTION W/ INTRAOCULAR LENS  IMPLANT, BILATERAL Bilateral ~ 2007  . DILATION AND CURETTAGE OF UTERUS  1970s X 1  . FRACTURE SURGERY    . HERNIA REPAIR    . NASAL FRACTURE SURGERY  2017  . NASAL SINUS SURGERY  2016  . TUBAL LIGATION    . UMBILICAL HERNIA REPAIR  ~ 2013  . VAGINAL HYSTERECTOMY  1978     reports that she has never smoked. She has never used smokeless tobacco. She reports that she does not drink alcohol or use drugs.  Allergies  Allergen Reactions  . Codeine Other (See Comments)    Pt says makes her crazy  . Gluten Other (See Comments)    Pt has celiac disease  . Prednisone Other (See Comments)    Pt says makes her crazy  . Wheat Other (See Comments)    Pt has celiac disease    History reviewed. No pertinent family history.  Prior to Admission medications   Medication  Sig Start Date End Date Taking? Authorizing Provider  albuterol (PROVENTIL HFA;VENTOLIN HFA) 108 (90 BASE) MCG/ACT inhaler Inhale 2 puffs into the lungs every 6 (six) hours as needed. For shortness of breath or wheezing   Yes Historical Provider, MD  gabapentin (NEURONTIN) 300 MG capsule Take 600 mg by mouth 2 (two) times daily.   Yes Historical Provider, MD  letrozole (FEMARA) 2.5 MG tablet Take 2.5 mg by mouth daily.   Yes Historical Provider, MD  Multiple Vitamin (MULITIVITAMIN WITH MINERALS) TABS Take 1 tablet by mouth daily.   Yes Historical Provider, MD    pantoprazole (PROTONIX) 40 MG tablet Take 40 mg by mouth daily.   Yes Historical Provider, MD  triamterene-hydrochlorothiazide (MAXZIDE-25) 37.5-25 MG per tablet Take 1 tablet by mouth daily.   Yes Historical Provider, MD  amoxicillin (AMOXIL) 875 MG tablet Take 1 tablet (875 mg total) by mouth 2 (two) times daily. (Rx void after 04/16/14) 04/09/14   [name], MD  benzonatate (TESSALON) 200 MG capsule Take 1 capsule (200 mg total) by mouth at bedtime. Take as needed for cough 04/09/14   [name], MD  cetirizine (ZYRTEC) 10 MG tablet Take 1 daily at bedtime as needed for allergies. 02/19/12   [name], MD  naproxen sodium (ANAPROX) 220 MG tablet Take 220 mg by mouth 2 (two) times daily as needed. For pain    Historical Provider, MD    Physical Exam:  Constitutional: Elderly female who appears acutely sick, but nontoxic able to follow commands.  Vitals:   11/01/16 2100 11/01/16 2130 11/01/16 2200 11/01/16 2321  BP: 140/66 147/70 148/69 140/63  Pulse: 86 93 90 92  Resp: 17 19 18 17   Temp:    98.1 F (36.7 C)  TempSrc:    Oral  SpO2: 96% 98% 97% 94%  Weight:      Height:    4\' 11"  (1.499 m)   Eyes: PERRL, lids and conjunctivae normal ENMT: Mucous membranes are moist. Posterior pharynx clear of any exudate or lesions.Normal dentition.  Neck: normal, supple, no masses, no thyromegaly Respiratory: Currently on 2 L nasal cannula oxygen, clear to auscultation bilaterally, no wheezing, no crackles. Normal respiratory effort. No accessory muscle use.  Cardiovascular: Regular rate and rhythm, no murmurs / rubs / gallops. No extremity edema. 2+ pedal pulses. No carotid bruits.  Abdomen: no tenderness, no masses palpated. No hepatosplenomegaly. Bowel sounds positive.  Musculoskeletal: no clubbing / cyanosis. No joint deformity upper and lower extremities. Good ROM, no contractures. Normal muscle tone.  Skin: no rashes, lesions, ulcers. No induration Neurologic: CN 2-12 grossly intact.  Sensation intact, DTR normal. Strength 5/5 in all 4.  Psychiatric: Normal judgment and insight. Alert and oriented x 3. Normal mood.     Labs on Admission: I have personally reviewed following labs and imaging studies  CBC:  Recent Labs Lab 11/01/16 1958  WBC 5.0  NEUTROABS 4.0  HGB 12.8  HCT 40.0  MCV 88.7  PLT [id number]   Basic Metabolic Panel:  Recent Labs Lab 11/01/16 1958  NA 132*  K 3.3*  CL 96*  CO2 26  GLUCOSE 108*  BUN 10  CREATININE 0.89  CALCIUM 8.9   GFR: Estimated Creatinine Clearance: 44 mL/min (by C-G formula based on SCr of 0.89 mg/dL). Liver Function Tests:  Recent Labs Lab 11/01/16 1958  AST 24  ALT 15  ALKPHOS 70  BILITOT 0.6  PROT 7.6  ALBUMIN 4.0   No results for input(s): LIPASE, AMYLASE in  the last 168 hours. No results for input(s): AMMONIA in the last 168 hours. Coagulation Profile: No results for input(s): INR, PROTIME in the last 168 hours. Cardiac Enzymes: No results for input(s): CKTOTAL, CKMB, CKMBINDEX, TROPONINI in the last 168 hours. BNP (last 3 results) No results for input(s): PROBNP in the last 8760 hours. HbA1C: No results for input(s): HGBA1C in the last 72 hours. CBG: No results for input(s): GLUCAP in the last 168 hours. Lipid Profile: No results for input(s): CHOL, HDL, LDLCALC, TRIG, CHOLHDL, LDLDIRECT in the last 72 hours. Thyroid Function Tests: No results for input(s): TSH, T4TOTAL, FREET4, T3FREE, THYROIDAB in the last 72 hours. Anemia Panel: No results for input(s): VITAMINB12, FOLATE, FERRITIN, TIBC, IRON, RETICCTPCT in the last 72 hours. Urine analysis:    Component Value Date/Time   COLORURINE YELLOW 11/01/2016 1958   APPEARANCEUR CLOUDY (A) 11/01/2016 1958   LABSPEC 1.013 11/01/2016 1958   PHURINE 7.0 11/01/2016 1958   GLUCOSEU NEGATIVE 11/01/2016 1958   HGBUR NEGATIVE 11/01/2016 1958   BILIRUBINUR NEGATIVE 11/01/2016 1958   KETONESUR NEGATIVE 11/01/2016 1958   PROTEINUR NEGATIVE 11/01/2016 1958     NITRITE POSITIVE (A) 11/01/2016 1958   LEUKOCYTESUR MODERATE (A) 11/01/2016 1958   Sepsis Labs: No results found for this or any previous visit (from the past 240 hour(s)).   Radiological Exams on Admission: Dg Chest 2 View  Result Date: 11/01/2016 CLINICAL DATA:  76 year old female with cough for 4 days. Initial encounter. EXAM: CHEST  2 VIEW COMPARISON:  [facility] 07/20/2015 FINDINGS: Stable lung volumes. Mediastinal contours remain within normal limits. No pneumothorax, pulmonary edema, pleural effusion or confluent pulmonary opacity. Stable mild eventration of the diaphragm. Osteopenia with mildly exaggerated thoracic kyphosis. Stable visualized osseous structures. Visible bowel gas pattern is within normal limits. IMPRESSION: No acute cardiopulmonary abnormality. Electronically Signed   By: Genevie Ann M.D.   On: 11/01/2016 16:25    EKG: Independently reviewed. Sinus rhythm with signs of LVH  Assessment/Plan SIRS: Patient noted have fever up to 101.84F, and heart rate up to 100. Otherwise all other vitals appear to be relatively within normal limits with reassuring lactic acid and normal WBC count. - Admit to MedSurg  Influzena A: Acute. Patient positive for influenza A. Risk factors include recent visits to the nursing home - Tylenol prn fever  - Will treat to with Tamiflu although questionable time interval of symptoms. - Mucinex - DuoNeb's - Supplemental Oxygen as needed  Urinary tract infection: acute. - Follow-up urine culture - Continue Rocephin per pharmacy  Hypokalemia - 40 mEq of potassium chloride 1 dose now  Diarrhea: Thought to be secondary to viral etiology - Lomotil prn - Consider further need of workup of diarrhea persist   Celiac disease  - Gluten-free diet   History of breast cancer  - continue letrozole  GERD - Continue Protonix  DVT prophylaxis:lovenox  Code Status: Full Family Communication:  discussed plan of care with patient and family   Disposition Plan: likely  discharge home once medically stable  Consults called: none Admission status: Observation   Norval Morton MD [facility] Pager [phone]  If 7PM-7AM, please contact night-coverage [email or web address] Password [email or web address]  11/02/2016, 1:53 AM

## 2016-11-02 NOTE — Care Management (Addendum)
Pt has discharge orders written.  CM spoke with pt prior to discharge - CM confirmed that pts PCP is Jefm Petty in [facility] - pt stated that she sees PCP on a regular basis.  Pt to contact office post discharge to arrange follow up appt.   Pt also confirmed that she has both [place] and prescription coverage and denied hardship/barriers to obtaining prescriptions.  Pt to provide insurance cards to admissions at a later date. Pt stated PTA she was completely independent from home alone and will have support in the home with her for at least 48 hours post discharge.  Pt denied all barriers to discharge.  No other CM needs identified prior to discharge

## 2016-11-02 NOTE — Progress Notes (Signed)
Pharmacy Antibiotic Note  Lindsey Lowe is a 76 y.o. female admitted on 11/01/2016 with UTI.  Pharmacy has been consulted for Rocephin dosing.  Rocephin 1gm IV given at [facility] ED ~2100  Plan: Rocephin 1gm IV q24h Pharmacy will sign off - please reconsult if needed  Height: 4\' 11"  (149.9 cm) Weight: 152 lb (68.9 kg) IBW/kg (Calculated) : 43.2  Temp (24hrs), Avg:99.3 F (37.4 C), Min:98.1 F (36.7 C), Max:101.8 F (38.8 C)   Recent Labs Lab 11/01/16 1958 11/01/16 2006  WBC 5.0  --   CREATININE 0.89  --   LATICACIDVEN  --  0.93    Estimated Creatinine Clearance: 44 mL/min (by C-G formula based on SCr of 0.89 mg/dL).    Allergies  Allergen Reactions  . Codeine Other (See Comments)    Pt says makes her crazy  . Gluten Other (See Comments)    Pt has celiac disease  . Prednisone Other (See Comments)    Pt says makes her crazy  . Wheat Other (See Comments)    Pt has celiac disease   Thank you for allowing pharmacy to be a part of this patient's care.  Sherlon Handing, PharmD, BCPS Clinical pharmacist, pager [phone] 11/02/2016 2:46 AM

## 2016-11-02 NOTE — Progress Notes (Signed)
Patient ambulated to restroom and down the hallway on room air. Oxygen saturation remained between 91-93%. Patient denies shortness of breath. Will leave on room air and continue to monitor. Wyonia Hough

## 2016-11-02 NOTE — Care Management Note (Signed)
Case Management Note  Patient Details  Name: Lindsey Lowe MRN: [id number] Date of Birth: 06-04-1941  Subjective/Objective:                 Patient transferred from med center [facility] in [place] for flu. Ambulated well on RA, no O2 needs at this time.  PCP Dr Jeralene Huff [facility]   Action/Plan:  DC to home self care likely later today.  Expected Discharge Date:                  Expected Discharge Plan:  Home/Self Care  In-House Referral:     Discharge planning Services  CM Consult  Post Acute Care Choice:    Choice offered to:     DME Arranged:    DME Agency:     HH Arranged:    [place] Agency:     Status of Service:  Completed, signed off  If discussed at [employer] of Stay Meetings, dates discussed:    Additional Comments:  Carles Collet, RN 11/02/2016, 12:30 PM

## 2016-11-02 NOTE — Progress Notes (Signed)
Nsg Discharge Note  Admit Date:  11/01/2016 Discharge date: 11/02/2016   Tommie Ard to be D/C'd home per MD order.  AVS completed.  Copy for chart, and copy for patient signed, and dated. Patient/caregiver able to verbalize understanding.  Discharge Medication: Allergies as of 11/02/2016      Reactions   Codeine Other (See Comments)   Pt says makes her crazy   Gluten Other (See Comments)   Pt has celiac disease   Prednisone Other (See Comments)   Pt says makes her crazy   Wheat Other (See Comments)   Pt has celiac disease      Medication List    STOP taking these medications   amoxicillin 875 MG tablet Commonly known as:  AMOXIL   naproxen sodium 220 MG tablet Commonly known as:  ANAPROX     TAKE these medications   albuterol 108 (90 Base) MCG/ACT inhaler Commonly known as:  PROVENTIL HFA;VENTOLIN HFA Inhale 2 puffs into the lungs every 6 (six) hours as needed. For shortness of breath or wheezing   benzonatate 200 MG capsule Commonly known as:  TESSALON Take 1 capsule (200 mg total) by mouth at bedtime. Take as needed for cough   cetirizine 10 MG tablet Commonly known as:  ZYRTEC Take 1 daily at bedtime as needed for allergies.   diphenoxylate-atropine 2.5-0.025 MG tablet Commonly known as:  LOMOTIL Take 1 tablet by mouth 4 (four) times daily as needed for diarrhea or loose stools.   FLUoxetine 20 MG tablet Commonly known as:  PROZAC Take 20 mg by mouth daily.   gabapentin 300 MG capsule Commonly known as:  NEURONTIN Take 600 mg by mouth 2 (two) times daily.   guaiFENesin 600 MG 12 hr tablet Commonly known as:  MUCINEX Take 1 tablet (600 mg total) by mouth 2 (two) times daily.   multivitamin with minerals Tabs tablet Take 1 tablet by mouth daily.   oseltamivir 75 MG capsule Commonly known as:  TAMIFLU Take 1 capsule (75 mg total) by mouth 2 (two) times daily.   pantoprazole 40 MG tablet Commonly known as:  PROTONIX Take 40 mg by mouth daily.    sulfamethoxazole-trimethoprim 800-160 MG tablet Commonly known as:  BACTRIM DS,SEPTRA DS Take 1 tablet by mouth 2 (two) times daily.   triamterene-hydrochlorothiazide 37.5-25 MG tablet Commonly known as:  MAXZIDE-25 Take 1 tablet by mouth daily.       Discharge Assessment: Vitals:   11/02/16 0553 11/02/16 1506  BP: (!) 149/66 136/62  Pulse: 86 89  Resp: 16 18  Temp: 99.1 F (37.3 C) 99 F (37.2 C)  Provider aware of patient's temperature baseline.    Skin clean, dry and intact without evidence of skin break down, no evidence of skin tears noted. IV catheter discontinued intact. Site without signs and symptoms of complications - no redness or edema noted at insertion site, patient denies c/o pain - only slight tenderness at site.  Dressing with slight pressure applied.  D/c Instructions-Education: Discharge instructions given to patient/family with verbalized understanding. Writer explained that patient already received two doses of gabapentin and mucinex and does not need another dose today- confirmed with Sharyn Lull in Pharmacy- and patient verbalized understanding. D/c education completed with patient/family including follow up instructions, medication list, d/c activities limitations if indicated, with other d/c instructions as indicated by MD - patient able to verbalize understanding, all questions fully answered. Patient instructed to return to ED, call 911, or call MD for any changes in condition.  Patient escorted via [place], and D/C home via private auto.  Wyonia Hough, RN 11/02/2016 1800

## 2016-11-02 NOTE — Discharge Instructions (Signed)
Follow with Primary MD No primary care provider on file. in 7 days   Get CBC, CMP, 2 view Chest X ray checked  by Primary MD next visit.    Activity: As tolerated with Full fall precautions use walker/cane & assistance as needed   Disposition Home    Diet: Heart Healthy  , with feeding assistance and aspiration precautions.  For Heart failure patients - Check your Weight same time everyday, if you gain over 2 pounds, or you develop in leg swelling, experience more shortness of breath or chest pain, call your Primary MD immediately. Follow Cardiac Low Salt Diet and 1.5 lit/day fluid restriction.   On your next visit with your primary care physician please Get Medicines reviewed and adjusted.   Please request your Prim.MD to go over all Hospital Tests and Procedure/Radiological results at the follow up, please get all Hospital records sent to your Prim MD by signing hospital release before you go home.   If you experience worsening of your admission symptoms, develop shortness of breath, life threatening emergency, suicidal or homicidal thoughts you must seek medical attention immediately by calling 911 or calling your MD immediately  if symptoms less severe.  You Must read complete instructions/literature along with all the possible adverse reactions/side effects for all the Medicines you take and that have been prescribed to you. Take any new Medicines after you have completely understood and accpet all the possible adverse reactions/side effects.   Do not drive, operating heavy machinery, perform activities at heights, swimming or participation in water activities or provide baby sitting services if your were admitted for syncope or siezures until you have seen by Primary MD or a Neurologist and advised to do so again.  Do not drive when taking Pain medications.    Do not take more than prescribed Pain, Sleep and Anxiety Medications  Special Instructions: If you have smoked or  chewed Tobacco  in the last 2 yrs please stop smoking, stop any regular Alcohol  and or any Recreational drug use.  Wear Seat belts while driving.   Please note  You were cared for by a hospitalist during your hospital stay. If you have any questions about your discharge medications or the care you received while you were in the hospital after you are discharged, you can call the unit and asked to speak with the hospitalist on call if the hospitalist that took care of you is not available. Once you are discharged, your primary care physician will handle any further medical issues. Please note that NO REFILLS for any discharge medications will be authorized once you are discharged, as it is imperative that you return to your primary care physician (or establish a relationship with a primary care physician if you do not have one) for your aftercare needs so that they can reassess your need for medications and monitor your lab values.

## 2016-11-02 NOTE — Discharge Summary (Signed)
Lindsey Lowe, is a 76 y.o. female  DOB 08-24-1941  MRN [id number].  Admission date:  11/01/2016  Admitting Physician  Norval Morton, MD  Discharge Date:  11/02/2016   Primary MD  No primary care provider on file.  Recommendations for primary care physician for things to follow:  - Please check CBC, BMP during next visit - Please follow on the final results of treatment culture   Admission Diagnosis  cough, vomiting, diarrhea and congestion   Discharge Diagnosis  cough, vomiting, diarrhea and congestion   Principal Problem:   Influenza A Active Problems:   UTI (urinary tract infection)   Hypokalemia   Celiac disease   SIRS (systemic inflammatory response syndrome) ([place])      Past Medical History:  Diagnosis Date  . Arthritis    "all over" (11/01/2016)  . Breast cancer in female [facility]) 2008   left  . Chronic lower back pain   . Depression   . Fibromyalgia   . GERD (gastroesophageal reflux disease)   . History of high cholesterol   . Hypertension     Past Surgical History:  Procedure Laterality Date  . BREAST BIOPSY Left 2008  . BREAST LUMPECTOMY Left 2008  . BREAST SURGERY Left 2008   "went in & looked around to be sure they got it all"  . CATARACT EXTRACTION W/ INTRAOCULAR LENS  IMPLANT, BILATERAL Bilateral ~ 2007  . DILATION AND CURETTAGE OF UTERUS  1970s X 1  . FRACTURE SURGERY    . HERNIA REPAIR    . NASAL FRACTURE SURGERY  2017  . NASAL SINUS SURGERY  2016  . TUBAL LIGATION    . UMBILICAL HERNIA REPAIR  ~ 2013  . VAGINAL HYSTERECTOMY  1978       History of present illness and  Hospital Course:     Kindly see H&P for history of present illness and admission details, please review complete Labs, Consult reports and Test reports for all details in brief  HPI  from the history and physical done on the day of admission 10/02/2016 HPI: Lindsey Lowe is a 76 y.o. female with  medical history significant of HTN and breast cancer; who presents with complaints of cough and shortness of breath. She has not been feeling well for the last 4 days. She initially reported nausea vomiting and diarrhea and had episode where she passed out briefly lost consciousness for  a brief period in time. She did not go to the emergency department for further evaluation. Thereafter she noted onset of nausea, vomiting, and diarrhea. She denies any knowledge of any recent sick contacts, but she had recently visited did nursing home multiple times to visit a friend the last few weeks. She reports that symptoms of nausea vomiting self resolve the following day. However, 3 days ago she developed a productive cough with sputum production. Patient reports that she was coughing so frequently that she developed chest soreness. for Associated symptoms include shortness of breath, decreased oral intake, headache, subjective fever as her  thermometer would not work, and muscle aches. She tried taking over-the-counter medications without relief of symptoms. Family gives additional history that the patient was noted to have some stomach virus a week of Christmas that self resolved. Patient notes that she continues to have diarrhea.   ED Course: On admission to the emergency department patient was evaluated and seen have a temperature up to 101.81F, O2 saturations documented as low as 88% for which patient was placed on 2 L nasal cannula oxygen, and all other vitals relatively within normal limits. CBC was within normal limits, sodium 131, potassium 3.3, chloride 96, lactic acid 0.93. Chest x-ray showed no acute abnormalities.     Hospital Course    SIRS:  - Secondary to influenza infection  Influzena A:  Acute. Patient positive for influenza A. Risk factors include recent visits to the nursing home - We'll treat with Tamiflu, to finish total of 5 days, Shirley hypoxic on presentation, but ambulated  today, tolerated well, no hypoxia, continue with Mucinex.   Urinary tract infection: acute. Heaved one dose of Rocephin during hospital stay, will discharge on Bactrim for 2 days  Hypokalemia - Repleted  Diarrhea:  - Thought to be secondary to viral etiology - Lomotil prn   Celiac disease  - Gluten-free diet   History of breast cancer  - continue letrozole  GERD - Continue Protonix   Discharge Condition:  Stable   Follow UP   With PCP in one week   Discharge Instructions  and  Discharge Medications     Discharge Instructions    Diet - low sodium heart healthy    Complete by:  As directed    Discharge instructions    Complete by:  As directed    Follow with Primary MD No primary care provider on file. in 7 days   Get CBC, CMP, 2 view Chest X ray checked  by Primary MD next visit.    Activity: As tolerated with Full fall precautions use walker/cane & assistance as needed   Disposition Home    Diet: Heart Healthy  , with feeding assistance and aspiration precautions.  For Heart failure patients - Check your Weight same time everyday, if you gain over 2 pounds, or you develop in leg swelling, experience more shortness of breath or chest pain, call your Primary MD immediately. Follow Cardiac Low Salt Diet and 1.5 lit/day fluid restriction.   On your next visit with your primary care physician please Get Medicines reviewed and adjusted.   Please request your Prim.MD to go over all Hospital Tests and Procedure/Radiological results at the follow up, please get all Hospital records sent to your Prim MD by signing hospital release before you go home.   If you experience worsening of your admission symptoms, develop shortness of breath, life threatening emergency, suicidal or homicidal thoughts you must seek medical attention immediately by calling 911 or calling your MD immediately  if symptoms less severe.  You Must read complete instructions/literature  along with all the possible adverse reactions/side effects for all the Medicines you take and that have been prescribed to you. Take any new Medicines after you have completely understood and accpet all the possible adverse reactions/side effects.   Do not drive, operating heavy machinery, perform activities at heights, swimming or participation in water activities or provide baby sitting services if your were admitted for syncope or siezures until you have seen by Primary MD or a Neurologist and advised to do so again.  Do not  drive when taking Pain medications.    Do not take more than prescribed Pain, Sleep and Anxiety Medications  Special Instructions: If you have smoked or chewed Tobacco  in the last 2 yrs please stop smoking, stop any regular Alcohol  and or any Recreational drug use.  Wear Seat belts while driving.   Please note  You were cared for by a hospitalist during your hospital stay. If you have any questions about your discharge medications or the care you received while you were in the hospital after you are discharged, you can call the unit and asked to speak with the hospitalist on call if the hospitalist that took care of you is not available. Once you are discharged, your primary care physician will handle any further medical issues. Please note that NO REFILLS for any discharge medications will be authorized once you are discharged, as it is imperative that you return to your primary care physician (or establish a relationship with a primary care physician if you do not have one) for your aftercare needs so that they can reassess your need for medications and monitor your lab values.   Increase activity slowly    Complete by:  As directed      Allergies as of 11/02/2016      Reactions   Codeine Other (See Comments)   Pt says makes her crazy   Gluten Other (See Comments)   Pt has celiac disease   Prednisone Other (See Comments)   Pt says makes her crazy   Wheat Other  (See Comments)   Pt has celiac disease      Medication List    STOP taking these medications   amoxicillin 875 MG tablet Commonly known as:  AMOXIL   naproxen sodium 220 MG tablet Commonly known as:  ANAPROX     TAKE these medications   albuterol 108 (90 Base) MCG/ACT inhaler Commonly known as:  PROVENTIL HFA;VENTOLIN HFA Inhale 2 puffs into the lungs every 6 (six) hours as needed. For shortness of breath or wheezing   benzonatate 200 MG capsule Commonly known as:  TESSALON Take 1 capsule (200 mg total) by mouth at bedtime. Take as needed for cough   cetirizine 10 MG tablet Commonly known as:  ZYRTEC Take 1 daily at bedtime as needed for allergies.   diphenoxylate-atropine 2.5-0.025 MG tablet Commonly known as:  LOMOTIL Take 1 tablet by mouth 4 (four) times daily as needed for diarrhea or loose stools.   FLUoxetine 20 MG tablet Commonly known as:  PROZAC Take 20 mg by mouth daily.   gabapentin 300 MG capsule Commonly known as:  NEURONTIN Take 600 mg by mouth 2 (two) times daily.   guaiFENesin 600 MG 12 hr tablet Commonly known as:  MUCINEX Take 1 tablet (600 mg total) by mouth 2 (two) times daily.   multivitamin with minerals Tabs tablet Take 1 tablet by mouth daily.   oseltamivir 75 MG capsule Commonly known as:  TAMIFLU Take 1 capsule (75 mg total) by mouth 2 (two) times daily.   pantoprazole 40 MG tablet Commonly known as:  PROTONIX Take 40 mg by mouth daily.   sulfamethoxazole-trimethoprim 800-160 MG tablet Commonly known as:  BACTRIM DS,SEPTRA DS Take 1 tablet by mouth 2 (two) times daily.   triamterene-hydrochlorothiazide 37.5-25 MG tablet Commonly known as:  MAXZIDE-25 Take 1 tablet by mouth daily.         Diet and Activity recommendation: See Discharge Instructions above   Consults obtained -  none   Major procedures and Radiology Reports - PLEASE review detailed and final reports for all details, in brief -    Dg Chest 2  View  Result Date: 11/01/2016 CLINICAL DATA:  76 year old female with cough for 4 days. Initial encounter. EXAM: CHEST  2 VIEW COMPARISON:  [facility] 07/20/2015 FINDINGS: Stable lung volumes. Mediastinal contours remain within normal limits. No pneumothorax, pulmonary edema, pleural effusion or confluent pulmonary opacity. Stable mild eventration of the diaphragm. Osteopenia with mildly exaggerated thoracic kyphosis. Stable visualized osseous structures. Visible bowel gas pattern is within normal limits. IMPRESSION: No acute cardiopulmonary abnormality. Electronically Signed   By: Genevie Ann M.D.   On: 11/01/2016 16:25    Micro Results     No results found for this or any previous visit (from the past 240 hour(s)).     Today   Subjective:   Haneefah Hope today has no headache,no chest or  abdominal pain, reports she is feeling better today , was able to ambulate with no hypoxia , still reports some diarrhea, but improving .   Objective:   Blood pressure (!) 149/66, pulse 86, temperature 99.1 F (37.3 C), temperature source Oral, resp. rate 16, height 4\' 11"  (1.499 m), weight 68.9 kg (152 lb), SpO2 93 %.   Intake/Output Summary (Last 24 hours) at 11/02/16 1322 Last data filed at 11/02/16 0400  Gross per 24 hour  Intake             1410 ml  Output                0 ml  Net             1410 ml    Exam Awake Alert, Oriented x 3, No new F.N deficits, Normal affect [place].AT,PERRAL Supple Neck,No JVD, No cervical lymphadenopathy appriciated.  Symmetrical Chest wall movement, Good air movement bilaterally, CTAB RRR,No Gallops,Rubs or new Murmurs, No Parasternal Heave +ve B.Sounds, Abd Soft, Non tender,  No rebound -guarding or rigidity. No Cyanosis, Clubbing or edema, No new Rash or bruise  Data Review   CBC w Diff:  Lab Results  Component Value Date   WBC 3.3 (L) 11/02/2016   HGB 12.2 11/02/2016   HCT 37.3 11/02/2016   PLT 274 11/02/2016   LYMPHOPCT 9 11/01/2016   MONOPCT  11 11/01/2016   EOSPCT 0 11/01/2016   BASOPCT 0 11/01/2016    CMP:  Lab Results  Component Value Date   NA 136 11/02/2016   K 3.4 (L) 11/02/2016   CL 100 (L) 11/02/2016   CO2 24 11/02/2016   BUN 8 11/02/2016   CREATININE 0.91 11/02/2016   PROT 7.6 11/01/2016   ALBUMIN 4.0 11/01/2016   BILITOT 0.6 11/01/2016   ALKPHOS 70 11/01/2016   AST 24 11/01/2016   ALT 15 11/01/2016  .   Total Time in preparing paper work, data evaluation and todays exam - 35 minutes  ELGERGAWY, DAWOOD M.D on 11/02/2016 at [facility]   Office  [phone]

## 2016-11-04 LAB — URINE CULTURE: Culture: >=100000 — AB

## 2016-11-07 LAB — CULTURE, BLOOD (ROUTINE X 2)
Culture: NO GROWTH
Culture: NO GROWTH

## 2017-07-23 IMAGING — CR DG CHEST 2V
2 series · 2 of 2 positions shown · non-contrast
Comparison: [REDACTED] 07/20/2015

CLINICAL DATA: 76-year-old female with cough for 4 days. Initial
encounter.

EXAM:
CHEST  2 VIEW

[w chest pa]
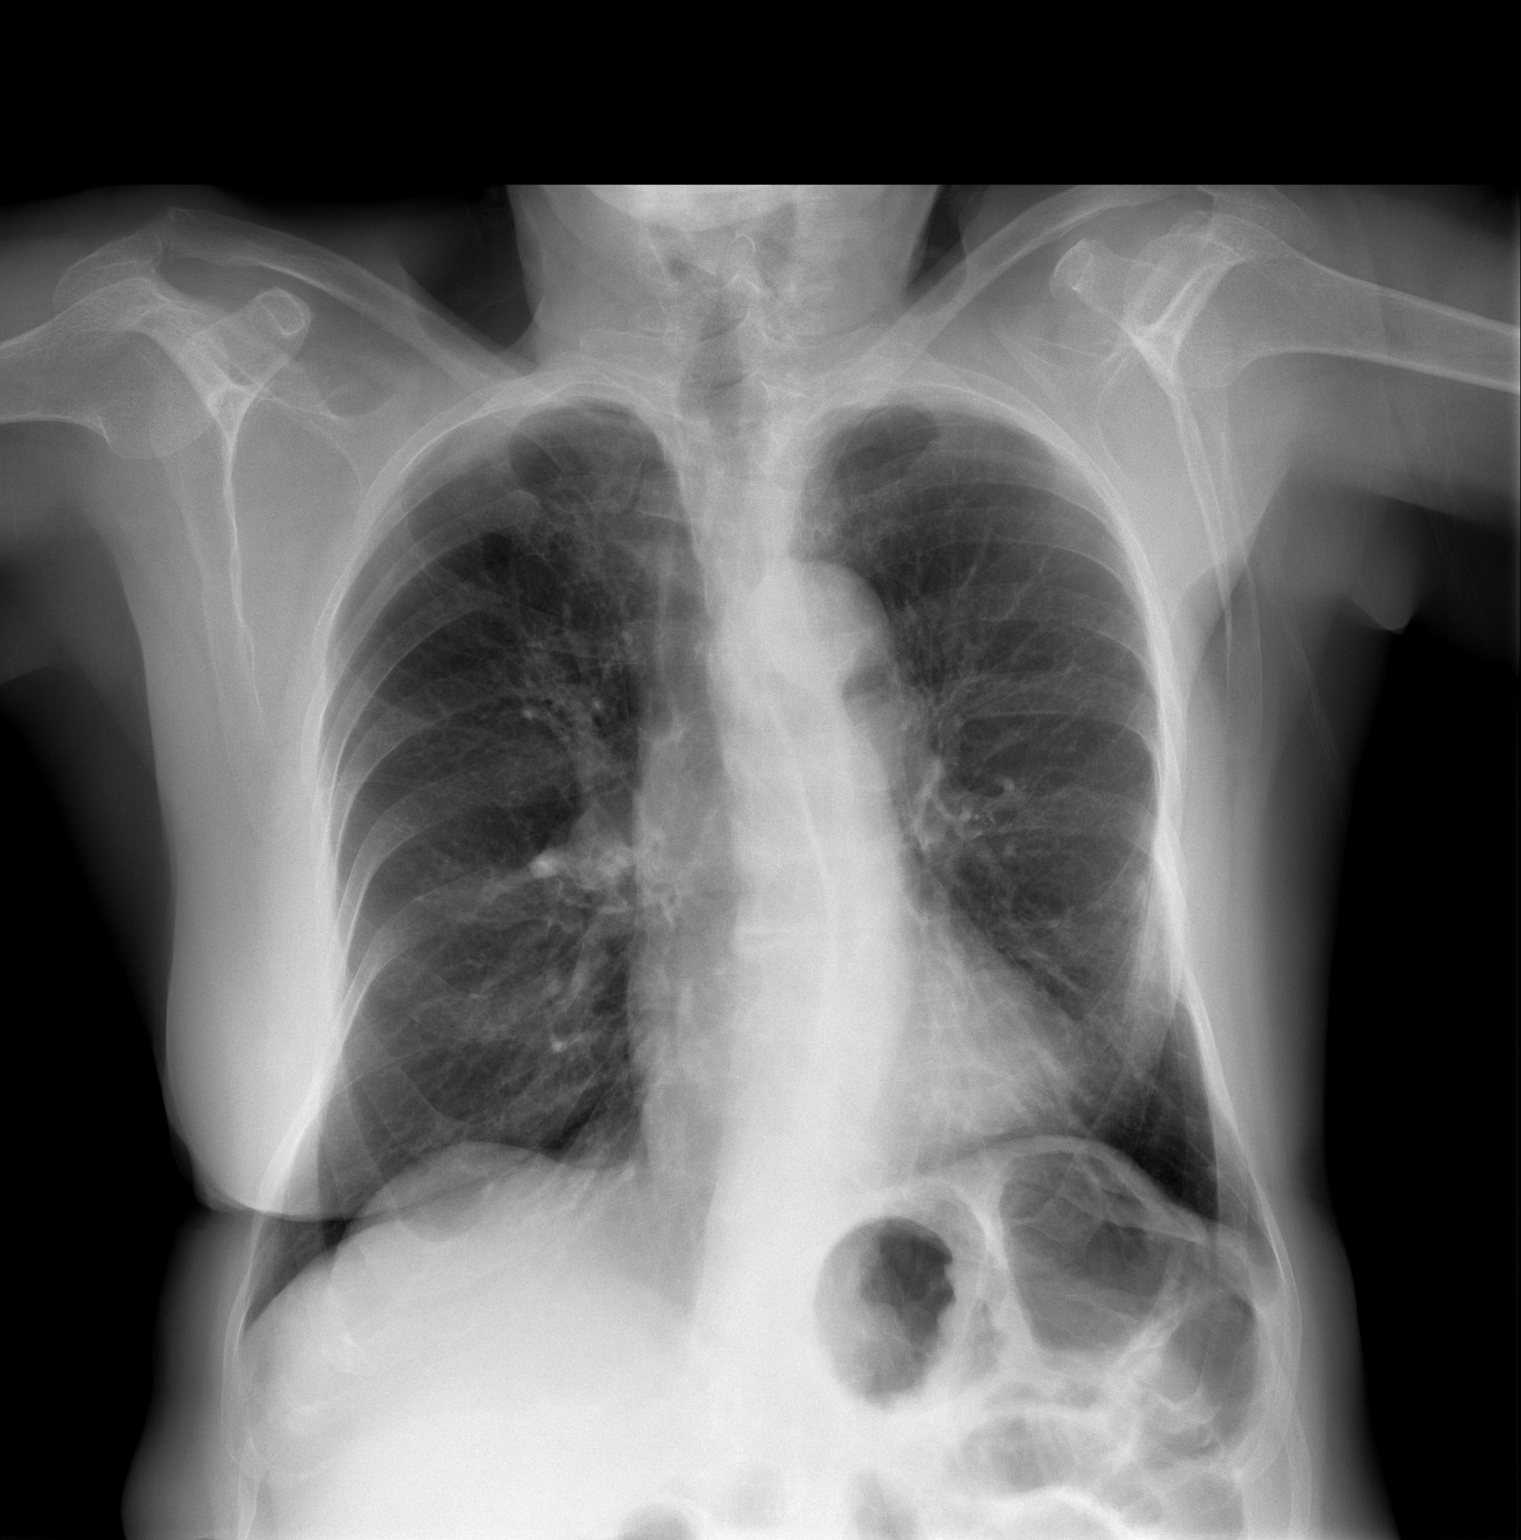

[w chest lat]
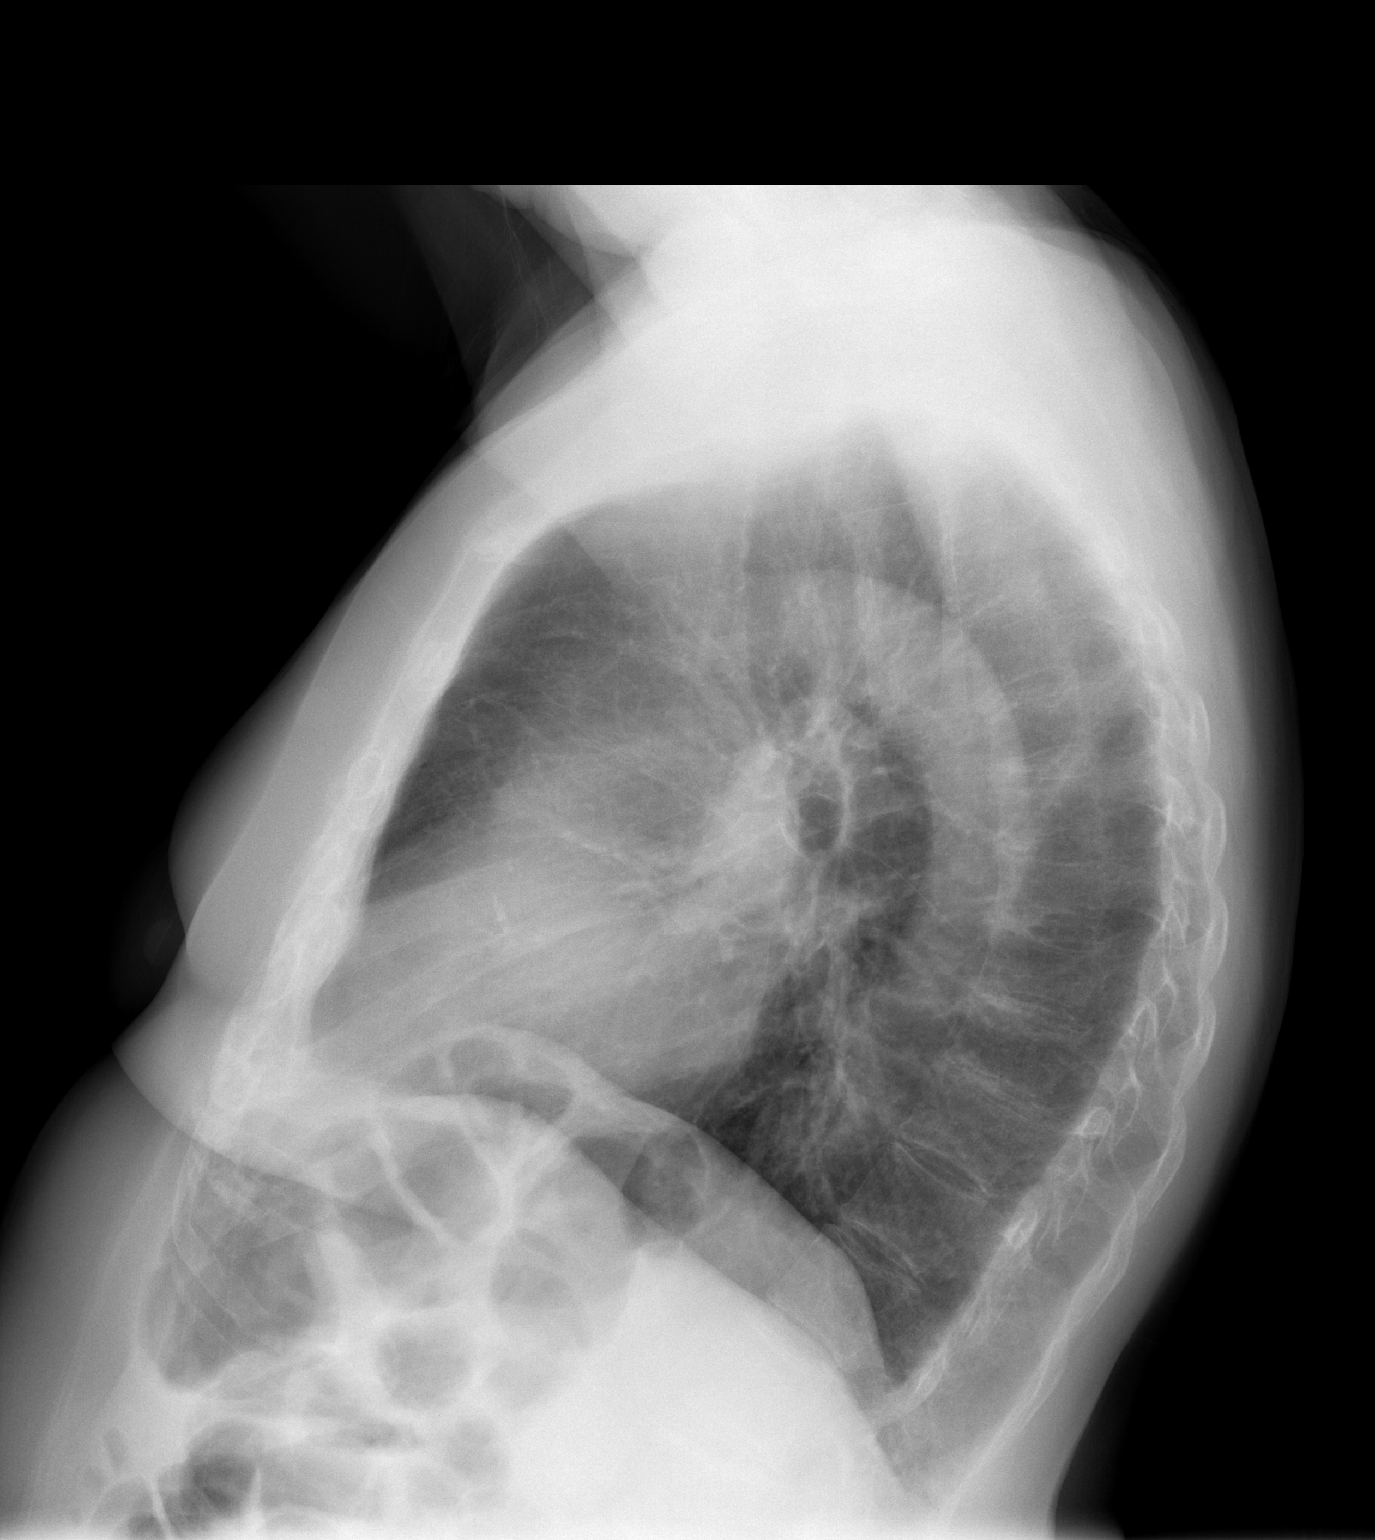

[2 of 2 positions shown; findings below may reference images not displayed]

FINDINGS: Stable lung volumes. Mediastinal contours remain within normal
limits. No pneumothorax, pulmonary edema, pleural effusion or
confluent pulmonary opacity. Stable mild eventration of the
diaphragm. Osteopenia with mildly exaggerated thoracic kyphosis.
Stable visualized osseous structures. Visible bowel gas pattern is
within normal limits.
IMPRESSION: No acute cardiopulmonary abnormality.

## 2021-07-20 DIAGNOSIS — C50212 Malignant neoplasm of upper-inner quadrant of left female breast: Secondary | ICD-10-CM | POA: Diagnosis not present

## 2021-07-20 DIAGNOSIS — Z170 Estrogen receptor positive status [ER+]: Secondary | ICD-10-CM | POA: Diagnosis not present

## 2021-08-19 DIAGNOSIS — Z5111 Encounter for antineoplastic chemotherapy: Secondary | ICD-10-CM | POA: Diagnosis not present

## 2021-08-19 DIAGNOSIS — Z170 Estrogen receptor positive status [ER+]: Secondary | ICD-10-CM | POA: Diagnosis not present

## 2021-08-19 DIAGNOSIS — C50212 Malignant neoplasm of upper-inner quadrant of left female breast: Secondary | ICD-10-CM | POA: Diagnosis not present

## 2021-09-01 DIAGNOSIS — Z170 Estrogen receptor positive status [ER+]: Secondary | ICD-10-CM | POA: Diagnosis not present

## 2021-09-01 DIAGNOSIS — R922 Inconclusive mammogram: Secondary | ICD-10-CM | POA: Diagnosis not present

## 2021-09-01 DIAGNOSIS — C50212 Malignant neoplasm of upper-inner quadrant of left female breast: Secondary | ICD-10-CM | POA: Diagnosis not present

## 2021-09-03 DIAGNOSIS — G501 Atypical facial pain: Secondary | ICD-10-CM | POA: Diagnosis not present

## 2021-09-03 DIAGNOSIS — I10 Essential (primary) hypertension: Secondary | ICD-10-CM | POA: Diagnosis not present

## 2021-09-03 DIAGNOSIS — M797 Fibromyalgia: Secondary | ICD-10-CM | POA: Diagnosis not present

## 2021-09-03 DIAGNOSIS — Z23 Encounter for immunization: Secondary | ICD-10-CM | POA: Diagnosis not present

## 2021-09-03 DIAGNOSIS — E559 Vitamin D deficiency, unspecified: Secondary | ICD-10-CM | POA: Diagnosis not present

## 2021-09-03 DIAGNOSIS — M810 Age-related osteoporosis without current pathological fracture: Secondary | ICD-10-CM | POA: Diagnosis not present

## 2021-09-03 DIAGNOSIS — J3089 Other allergic rhinitis: Secondary | ICD-10-CM | POA: Diagnosis not present

## 2021-09-03 DIAGNOSIS — F3340 Major depressive disorder, recurrent, in remission, unspecified: Secondary | ICD-10-CM | POA: Diagnosis not present

## 2021-09-03 DIAGNOSIS — Z0000 Encounter for general adult medical examination without abnormal findings: Secondary | ICD-10-CM | POA: Diagnosis not present

## 2021-09-03 DIAGNOSIS — Z170 Estrogen receptor positive status [ER+]: Secondary | ICD-10-CM | POA: Diagnosis not present

## 2021-09-03 DIAGNOSIS — R200 Anesthesia of skin: Secondary | ICD-10-CM | POA: Diagnosis not present

## 2021-09-03 DIAGNOSIS — K219 Gastro-esophageal reflux disease without esophagitis: Secondary | ICD-10-CM | POA: Diagnosis not present

## 2021-09-03 DIAGNOSIS — R053 Chronic cough: Secondary | ICD-10-CM | POA: Diagnosis not present

## 2021-09-03 DIAGNOSIS — C50212 Malignant neoplasm of upper-inner quadrant of left female breast: Secondary | ICD-10-CM | POA: Diagnosis not present

## 2021-09-06 DIAGNOSIS — Z08 Encounter for follow-up examination after completed treatment for malignant neoplasm: Secondary | ICD-10-CM | POA: Diagnosis not present

## 2021-09-06 DIAGNOSIS — Z170 Estrogen receptor positive status [ER+]: Secondary | ICD-10-CM | POA: Diagnosis not present

## 2021-09-06 DIAGNOSIS — Z853 Personal history of malignant neoplasm of breast: Secondary | ICD-10-CM | POA: Diagnosis not present

## 2021-09-06 DIAGNOSIS — C50212 Malignant neoplasm of upper-inner quadrant of left female breast: Secondary | ICD-10-CM | POA: Diagnosis not present

## 2021-09-18 ENCOUNTER — Other Ambulatory Visit: Payer: Self-pay

## 2021-09-18 ENCOUNTER — Emergency Department
Admission: EM | Admit: 2021-09-18 | Discharge: 2021-09-18 | Disposition: A | Discharge status: Home / Self Care | Payer: Medicare HMO | Source: Home / Self Care

## 2021-09-18 DIAGNOSIS — R059 Cough, unspecified: Secondary | ICD-10-CM | POA: Diagnosis not present

## 2021-09-18 DIAGNOSIS — J0100 Acute maxillary sinusitis, unspecified: Secondary | ICD-10-CM | POA: Diagnosis not present

## 2021-09-18 DIAGNOSIS — J309 Allergic rhinitis, unspecified: Secondary | ICD-10-CM

## 2021-09-18 MED ORDER — CEFDINIR 300 MG PO CAPS: 300.0000 mg | ORAL_CAPSULE | Freq: Two times a day (BID) | ORAL | 0 refills | Status: AC

## 2021-09-18 MED ORDER — BENZONATATE 200 MG PO CAPS: 200.0000 mg | ORAL_CAPSULE | Freq: Three times a day (TID) | ORAL | 0 refills | Status: AC | PRN

## 2021-09-18 MED ORDER — FEXOFENADINE HCL 180 MG PO TABS: 180.0000 mg | ORAL_TABLET | Freq: Every day | ORAL | 0 refills | Status: AC

## 2021-09-18 MED ORDER — PROMETHAZINE-DM 6.25-15 MG/5ML PO SYRP: 5.0000 mL | ORAL_SOLUTION | Freq: Two times a day (BID) | ORAL | 0 refills | Status: AC | PRN

## 2021-09-18 NOTE — ED Notes (Signed)
Attempted to call pt twice w/o answer.

## 2021-09-18 NOTE — Discharge Instructions (Addendum)
Advised patient to take medications as directed with food to completion.  Advised patient to take Allegra with first dose of cefdinir for 5 of 7 days.  Advised may use Allegra afterwards for concurrent postnasal drip/drainage.  Advised patient may use Tessalon Perles daily, as needed during the daytime for cough.  Advised patient may use Promethazine DM for evening or just prior to sleep.  Advised patient not to take these cough medicines together and to increase daily water intake while taking these medications.

## 2021-09-18 NOTE — ED Provider Notes (Signed)
[facility]    CSN: [id number] Arrival date & time: 09/18/21  1318      History   Chief Complaint Chief Complaint  Lindsey Lowe presents with   Cough    Pt states that she has a cough, chest congestion, head congestion, right ear pain and sore throat. X1 week    HPI Lindsey Lowe is a 80 y.o. female.   HPI 80 year old female presents with cough, chest congestion, sore throat, right ear pain and sinus nasal congestion for 1 week.  Past Medical History:  Diagnosis Date   Arthritis    "all over" (11/01/2016)   Breast cancer in female [facility]) 2008   left   Chronic lower back pain    Depression    Fibromyalgia    GERD (gastroesophageal reflux disease)    History of high cholesterol    Hypertension     Lindsey Lowe Active Problem List   Diagnosis Date Noted   UTI (urinary tract infection) 11/02/2016   Influenza A 11/02/2016   Hypokalemia 11/02/2016   Celiac disease 11/02/2016   SIRS (systemic inflammatory response syndrome) ([place]) 11/02/2016    Past Surgical History:  Procedure Laterality Date   BREAST BIOPSY Left 2008   BREAST LUMPECTOMY Left 2008   BREAST SURGERY Left 2008   "went in & looked around to be sure they got it all"   CATARACT EXTRACTION W/ INTRAOCULAR LENS  IMPLANT, BILATERAL Bilateral ~ 2007   DILATION AND CURETTAGE OF UTERUS  1970s X 1   FRACTURE SURGERY     HERNIA REPAIR     NASAL FRACTURE SURGERY  2017   NASAL SINUS SURGERY  2016   TUBAL LIGATION     UMBILICAL HERNIA REPAIR  ~ 2013   VAGINAL HYSTERECTOMY  1978    OB History   No obstetric history on file.      Home Medications    Prior to Admission medications   Medication Sig Start Date End Date Taking? Authorizing Provider  alendronate (FOSAMAX) 70 MG tablet Take by mouth. 09/03/21  Yes [provider]  amLODipine (NORVASC) 5 MG tablet Take 1 tablet by mouth daily. 09/03/21  Yes [provider]  ascorbic acid (VITAMIN C) 500 MG tablet Take by mouth.   Yes [provider]  aspirin 81 MG chewable tablet Chew by mouth.   Yes [provider]  benzonatate (TESSALON) 200 MG capsule Take 1 capsule (200 mg total) by mouth 3 (three) times daily as needed for up to 7 days for cough. 09/18/21 09/25/21 Yes Eliezer Lofts, FNP  Calcium Carbonate-Vitamin D (OYSTER SHELL CALCIUM/D) 500-5 MG-MCG TABS Take by mouth.   Yes [provider]  cefdinir (OMNICEF) 300 MG capsule Take 1 capsule (300 mg total) by mouth 2 (two) times daily for 7 days. 09/18/21 09/25/21 Yes Eliezer Lofts, FNP  cetirizine (ZYRTEC) 10 MG tablet Take 1 daily at bedtime as needed for allergies. 02/19/12  Yes Molpus, John, MD  fexofenadine [facility] ALLERGY) 180 MG tablet Take 1 tablet (180 mg total) by mouth daily for 15 days. 09/18/21 10/03/21 Yes Eliezer Lofts, FNP  FLUoxetine (PROZAC) 20 MG tablet Take 20 mg by mouth daily.   Yes [provider]  gabapentin (NEURONTIN) 300 MG capsule Take 600 mg by mouth 2 (two) times daily.   Yes [provider]  pantoprazole (PROTONIX) 40 MG tablet Take 40 mg by mouth daily.   Yes [provider]  promethazine-dextromethorphan (PROMETHAZINE-DM) 6.25-15 MG/5ML syrup Take 5 mLs by mouth 2 (  two) times daily as needed for cough. 09/18/21  Yes Eliezer Lofts, FNP  triamterene-hydrochlorothiazide (MAXZIDE-25) 37.5-25 MG per tablet Take 1 tablet by mouth daily.   Yes [provider]  valsartan-hydrochlorothiazide (DIOVAN-HCT) 320-12.5 MG tablet Take 1 tablet by mouth daily. 09/03/21  Yes [provider]  albuterol (PROVENTIL HFA;VENTOLIN HFA) 108 (90 BASE) MCG/ACT inhaler Inhale 2 puffs into the lungs every 6 (six) hours as needed. For shortness of breath or wheezing    [provider]  diphenoxylate-atropine (LOMOTIL) 2.5-0.025 MG tablet Take 1 tablet by mouth 4 (four) times daily as needed for diarrhea or loose stools. 11/02/16   Elgergawy, Silver Huguenin, MD  Multiple Vitamin (MULITIVITAMIN WITH MINERALS)  TABS Take 1 tablet by mouth daily.    [provider]    Family History History reviewed. No pertinent family history.  Social History Social History   Tobacco Use   Smoking status: Never   Smokeless tobacco: Never  Substance Use Topics   Alcohol use: No   Drug use: No     Allergies   Codeine, Gluten, Prednisone, and Wheat   Review of Systems Review of Systems  HENT:  Positive for congestion, ear pain and sneezing.   Respiratory:  Positive for cough.   All other systems reviewed and are negative.   Physical Exam Triage Vital Signs ED Triage Vitals  Enc Vitals Group     BP 09/18/21 1444 139/80     Pulse --      Resp 09/18/21 1444 18     Temp 09/18/21 1444 98 F (36.7 C)     Temp Source 09/18/21 1444 Oral     SpO2 09/18/21 1444 95 %     Weight 09/18/21 1439 147 lb (66.7 kg)     Height 09/18/21 1439 4\' 9"  (1.448 m)     Head Circumference --      Peak Flow --      Pain Score 09/18/21 1439 0     Pain Loc --      Pain Edu? --      Excl. in [place]? --    No data found.  Updated Vital Signs BP 139/80 (BP Location: Right Arm)   Temp 98 F (36.7 C) (Oral)   Resp 18   Ht 4\' 9"  (1.448 m)   Wt 147 lb (66.7 kg)   SpO2 95%   BMI 31.81 kg/m    Physical Exam Vitals and nursing note reviewed.  Constitutional:      General: She is not in acute distress.    Appearance: Normal appearance. She is normal weight. She is not ill-appearing.  HENT:     Head: Normocephalic and atraumatic.     Right Ear: Tympanic membrane and external ear normal.     Left Ear: Tympanic membrane and external ear normal.     Ears:     Comments: Moderate eustachian tube dysfunction noticed bilaterally    Mouth/Throat:     Mouth: Mucous membranes are moist.     Pharynx: Oropharynx is clear.     Comments: Moderate amount of clear drainage of posterior oropharynx noted Eyes:     Extraocular Movements: Extraocular movements intact.     Conjunctiva/sclera: Conjunctivae normal.      Pupils: Pupils are equal, round, and reactive to light.  Cardiovascular:     Rate and Rhythm: Normal rate and regular rhythm.     Pulses: Normal pulses.     Heart sounds: Normal heart sounds.  Pulmonary:  Effort: Pulmonary effort is normal.     Breath sounds: Normal breath sounds.  Musculoskeletal:        General: Normal range of motion.     Cervical back: Normal range of motion and neck supple.  Skin:    General: Skin is warm and dry.  Neurological:     General: No focal deficit present.     Mental Status: She is alert and oriented to person, place, and time.     UC Treatments / Results  Labs (all labs ordered are listed, but only abnormal results are displayed) Labs Reviewed - No data to display  EKG   Radiology No results found.  Procedures Procedures (including critical care time)  Medications Ordered in UC Medications - No data to display  Initial Impression / Assessment and Plan / UC Course  I have reviewed the triage vital signs and the nursing notes.  Pertinent labs & imaging results that were available during my care of the Lindsey Lowe were reviewed by me and considered in my medical decision making (see chart for details).     MDM: 1.  Subacute maxillary sinusitis-Rx'd Cefdinir; 2.  Cough-Rx'd Tessalon Perles for daytime and Promethazine DM for night or prior to sleep; 3.  Allergic rhinitis-Rx'd Allegra. Advised Lindsey Lowe to take medications as directed with food to completion.  Advised Lindsey Lowe to take Allegra with first dose of cefdinir for 5 of 7 days.  Advised may use Allegra afterwards for concurrent postnasal drip/drainage.  Advised Lindsey Lowe may use Tessalon Perles daily, as needed during the daytime for cough.  Advised Lindsey Lowe may use Promethazine DM for evening or just prior to sleep.  Advised Lindsey Lowe not to take these cough medicines together and to increase daily water intake while taking these medications.  Lindsey Lowe discharged home, hemodynamically  stable. Final Clinical Impressions(s) / UC Diagnoses   Final diagnoses:  Subacute maxillary sinusitis  Allergic rhinitis, unspecified seasonality, unspecified trigger  Cough, unspecified type     Discharge Instructions      Advised Lindsey Lowe to take medications as directed with food to completion.  Advised Lindsey Lowe to take Allegra with first dose of cefdinir for 5 of 7 days.  Advised may use Allegra afterwards for concurrent postnasal drip/drainage.  Advised Lindsey Lowe may use Tessalon Perles daily, as needed during the daytime for cough.  Advised Lindsey Lowe may use Promethazine DM for evening or just prior to sleep.  Advised Lindsey Lowe not to take these cough medicines together and to increase daily water intake while taking these medications.     ED Prescriptions     Medication Sig Dispense Auth. Provider   cefdinir (OMNICEF) 300 MG capsule Take 1 capsule (300 mg total) by mouth 2 (two) times daily for 7 days. 14 capsule Eliezer Lofts, FNP   fexofenadine [facility] ALLERGY) 180 MG tablet Take 1 tablet (180 mg total) by mouth daily for 15 days. 15 tablet Eliezer Lofts, FNP   benzonatate (TESSALON) 200 MG capsule Take 1 capsule (200 mg total) by mouth 3 (three) times daily as needed for up to 7 days for cough. 40 capsule Eliezer Lofts, FNP   promethazine-dextromethorphan (PROMETHAZINE-DM) 6.25-15 MG/5ML syrup Take 5 mLs by mouth 2 (two) times daily as needed for cough. 118 mL Eliezer Lofts, FNP      PDMP not reviewed this encounter.   Eliezer Lofts, [place] 09/18/21 1549

## 2021-09-18 NOTE — ED Triage Notes (Signed)
Pt states that she has a cough, chest congestion, sore throat, head congestion and right ear pain. X1 week.  Pt states that she is vaccinated.   Pt states that she has had her flu shot.

## 2021-10-11 DIAGNOSIS — E871 Hypo-osmolality and hyponatremia: Secondary | ICD-10-CM | POA: Diagnosis not present

## 2021-10-11 DIAGNOSIS — I10 Essential (primary) hypertension: Secondary | ICD-10-CM | POA: Diagnosis not present

## 2021-10-11 DIAGNOSIS — R112 Nausea with vomiting, unspecified: Secondary | ICD-10-CM | POA: Diagnosis not present

## 2021-10-11 DIAGNOSIS — K449 Diaphragmatic hernia without obstruction or gangrene: Secondary | ICD-10-CM | POA: Diagnosis not present

## 2021-10-11 DIAGNOSIS — N179 Acute kidney failure, unspecified: Secondary | ICD-10-CM | POA: Diagnosis not present

## 2021-10-11 DIAGNOSIS — F419 Anxiety disorder, unspecified: Secondary | ICD-10-CM | POA: Diagnosis not present

## 2021-10-11 DIAGNOSIS — N390 Urinary tract infection, site not specified: Secondary | ICD-10-CM | POA: Diagnosis not present

## 2021-10-11 DIAGNOSIS — G629 Polyneuropathy, unspecified: Secondary | ICD-10-CM | POA: Diagnosis not present

## 2021-10-11 DIAGNOSIS — K6389 Other specified diseases of intestine: Secondary | ICD-10-CM | POA: Diagnosis not present

## 2021-10-11 DIAGNOSIS — R55 Syncope and collapse: Secondary | ICD-10-CM | POA: Diagnosis not present

## 2021-10-11 DIAGNOSIS — I959 Hypotension, unspecified: Secondary | ICD-10-CM | POA: Diagnosis not present

## 2021-10-11 DIAGNOSIS — K900 Celiac disease: Secondary | ICD-10-CM | POA: Diagnosis not present

## 2021-10-11 DIAGNOSIS — R197 Diarrhea, unspecified: Secondary | ICD-10-CM | POA: Diagnosis not present

## 2021-10-11 DIAGNOSIS — R8281 Pyuria: Secondary | ICD-10-CM | POA: Diagnosis not present

## 2021-10-11 DIAGNOSIS — E872 Acidosis, unspecified: Secondary | ICD-10-CM | POA: Diagnosis not present

## 2021-10-11 DIAGNOSIS — E878 Other disorders of electrolyte and fluid balance, not elsewhere classified: Secondary | ICD-10-CM | POA: Diagnosis not present

## 2021-10-11 DIAGNOSIS — Z20822 Contact with and (suspected) exposure to covid-19: Secondary | ICD-10-CM | POA: Diagnosis not present

## 2021-10-11 DIAGNOSIS — R5383 Other fatigue: Secondary | ICD-10-CM | POA: Diagnosis not present

## 2021-10-11 DIAGNOSIS — R1110 Vomiting, unspecified: Secondary | ICD-10-CM | POA: Diagnosis not present

## 2021-10-11 DIAGNOSIS — R911 Solitary pulmonary nodule: Secondary | ICD-10-CM | POA: Diagnosis not present

## 2021-10-11 DIAGNOSIS — K529 Noninfective gastroenteritis and colitis, unspecified: Secondary | ICD-10-CM | POA: Diagnosis not present

## 2021-10-11 DIAGNOSIS — R531 Weakness: Secondary | ICD-10-CM | POA: Diagnosis not present

## 2021-10-12 DIAGNOSIS — E872 Acidosis, unspecified: Secondary | ICD-10-CM | POA: Diagnosis not present

## 2021-10-12 DIAGNOSIS — I10 Essential (primary) hypertension: Secondary | ICD-10-CM | POA: Diagnosis not present

## 2021-10-12 DIAGNOSIS — N179 Acute kidney failure, unspecified: Secondary | ICD-10-CM | POA: Diagnosis not present

## 2021-10-12 DIAGNOSIS — G629 Polyneuropathy, unspecified: Secondary | ICD-10-CM | POA: Diagnosis not present

## 2021-10-12 DIAGNOSIS — R112 Nausea with vomiting, unspecified: Secondary | ICD-10-CM | POA: Diagnosis not present

## 2021-10-12 DIAGNOSIS — R911 Solitary pulmonary nodule: Secondary | ICD-10-CM | POA: Diagnosis not present

## 2021-10-12 DIAGNOSIS — K900 Celiac disease: Secondary | ICD-10-CM | POA: Diagnosis not present

## 2021-10-12 DIAGNOSIS — R8281 Pyuria: Secondary | ICD-10-CM | POA: Diagnosis not present

## 2021-10-12 DIAGNOSIS — E871 Hypo-osmolality and hyponatremia: Secondary | ICD-10-CM | POA: Diagnosis not present

## 2021-10-12 DIAGNOSIS — R531 Weakness: Secondary | ICD-10-CM | POA: Diagnosis not present

## 2021-10-12 DIAGNOSIS — K529 Noninfective gastroenteritis and colitis, unspecified: Secondary | ICD-10-CM | POA: Diagnosis not present

## 2021-10-12 DIAGNOSIS — F419 Anxiety disorder, unspecified: Secondary | ICD-10-CM | POA: Diagnosis not present

## 2021-10-13 DIAGNOSIS — K900 Celiac disease: Secondary | ICD-10-CM | POA: Diagnosis not present

## 2021-10-13 DIAGNOSIS — G629 Polyneuropathy, unspecified: Secondary | ICD-10-CM | POA: Diagnosis not present

## 2021-10-13 DIAGNOSIS — R8281 Pyuria: Secondary | ICD-10-CM | POA: Diagnosis not present

## 2021-10-13 DIAGNOSIS — I10 Essential (primary) hypertension: Secondary | ICD-10-CM | POA: Diagnosis not present

## 2021-10-13 DIAGNOSIS — K529 Noninfective gastroenteritis and colitis, unspecified: Secondary | ICD-10-CM | POA: Diagnosis not present

## 2021-10-13 DIAGNOSIS — N179 Acute kidney failure, unspecified: Secondary | ICD-10-CM | POA: Diagnosis not present

## 2021-10-13 DIAGNOSIS — R531 Weakness: Secondary | ICD-10-CM | POA: Diagnosis not present

## 2021-10-13 DIAGNOSIS — R911 Solitary pulmonary nodule: Secondary | ICD-10-CM | POA: Diagnosis not present

## 2021-10-13 DIAGNOSIS — R112 Nausea with vomiting, unspecified: Secondary | ICD-10-CM | POA: Diagnosis not present

## 2021-10-13 DIAGNOSIS — F419 Anxiety disorder, unspecified: Secondary | ICD-10-CM | POA: Diagnosis not present

## 2021-10-13 DIAGNOSIS — E872 Acidosis, unspecified: Secondary | ICD-10-CM | POA: Diagnosis not present

## 2021-10-13 DIAGNOSIS — E871 Hypo-osmolality and hyponatremia: Secondary | ICD-10-CM | POA: Diagnosis not present

## 2021-10-14 DIAGNOSIS — N179 Acute kidney failure, unspecified: Secondary | ICD-10-CM | POA: Diagnosis not present

## 2021-10-14 DIAGNOSIS — F419 Anxiety disorder, unspecified: Secondary | ICD-10-CM | POA: Diagnosis not present

## 2021-10-14 DIAGNOSIS — G629 Polyneuropathy, unspecified: Secondary | ICD-10-CM | POA: Diagnosis not present

## 2021-10-14 DIAGNOSIS — E871 Hypo-osmolality and hyponatremia: Secondary | ICD-10-CM | POA: Diagnosis not present

## 2021-10-14 DIAGNOSIS — R531 Weakness: Secondary | ICD-10-CM | POA: Diagnosis not present

## 2021-10-14 DIAGNOSIS — E872 Acidosis, unspecified: Secondary | ICD-10-CM | POA: Diagnosis not present

## 2021-10-14 DIAGNOSIS — K900 Celiac disease: Secondary | ICD-10-CM | POA: Diagnosis not present

## 2021-10-14 DIAGNOSIS — R112 Nausea with vomiting, unspecified: Secondary | ICD-10-CM | POA: Diagnosis not present

## 2021-10-14 DIAGNOSIS — R911 Solitary pulmonary nodule: Secondary | ICD-10-CM | POA: Diagnosis not present

## 2021-10-14 DIAGNOSIS — I10 Essential (primary) hypertension: Secondary | ICD-10-CM | POA: Diagnosis not present

## 2021-10-14 DIAGNOSIS — K529 Noninfective gastroenteritis and colitis, unspecified: Secondary | ICD-10-CM | POA: Diagnosis not present

## 2021-10-14 DIAGNOSIS — R8281 Pyuria: Secondary | ICD-10-CM | POA: Diagnosis not present

## 2021-10-19 DIAGNOSIS — N179 Acute kidney failure, unspecified: Secondary | ICD-10-CM | POA: Diagnosis not present

## 2021-10-19 DIAGNOSIS — R918 Other nonspecific abnormal finding of lung field: Secondary | ICD-10-CM | POA: Diagnosis not present

## 2021-10-19 DIAGNOSIS — K529 Noninfective gastroenteritis and colitis, unspecified: Secondary | ICD-10-CM | POA: Diagnosis not present

## 2021-10-19 DIAGNOSIS — K900 Celiac disease: Secondary | ICD-10-CM | POA: Diagnosis not present

## 2021-10-19 DIAGNOSIS — Z09 Encounter for follow-up examination after completed treatment for conditions other than malignant neoplasm: Secondary | ICD-10-CM | POA: Diagnosis not present

## 2021-10-19 DIAGNOSIS — E871 Hypo-osmolality and hyponatremia: Secondary | ICD-10-CM | POA: Diagnosis not present

## 2021-10-19 DIAGNOSIS — R8281 Pyuria: Secondary | ICD-10-CM | POA: Diagnosis not present

## 2021-10-21 DIAGNOSIS — Z7982 Long term (current) use of aspirin: Secondary | ICD-10-CM | POA: Diagnosis not present

## 2021-10-21 DIAGNOSIS — Z86711 Personal history of pulmonary embolism: Secondary | ICD-10-CM | POA: Diagnosis not present

## 2021-10-21 DIAGNOSIS — Z170 Estrogen receptor positive status [ER+]: Secondary | ICD-10-CM | POA: Diagnosis not present

## 2021-10-21 DIAGNOSIS — C50212 Malignant neoplasm of upper-inner quadrant of left female breast: Secondary | ICD-10-CM | POA: Diagnosis not present

## 2021-10-21 DIAGNOSIS — Z5111 Encounter for antineoplastic chemotherapy: Secondary | ICD-10-CM | POA: Diagnosis not present

## 2021-11-18 DIAGNOSIS — Z5111 Encounter for antineoplastic chemotherapy: Secondary | ICD-10-CM | POA: Diagnosis not present

## 2021-11-18 DIAGNOSIS — C50212 Malignant neoplasm of upper-inner quadrant of left female breast: Secondary | ICD-10-CM | POA: Diagnosis not present

## 2021-11-18 DIAGNOSIS — Z170 Estrogen receptor positive status [ER+]: Secondary | ICD-10-CM | POA: Diagnosis not present

## 2021-11-22 DIAGNOSIS — K900 Celiac disease: Secondary | ICD-10-CM | POA: Diagnosis not present

## 2021-11-22 DIAGNOSIS — Z8719 Personal history of other diseases of the digestive system: Secondary | ICD-10-CM | POA: Diagnosis not present

## 2021-11-22 DIAGNOSIS — K219 Gastro-esophageal reflux disease without esophagitis: Secondary | ICD-10-CM | POA: Diagnosis not present

## 2021-11-22 DIAGNOSIS — R197 Diarrhea, unspecified: Secondary | ICD-10-CM | POA: Diagnosis not present

## 2021-12-16 DIAGNOSIS — Z170 Estrogen receptor positive status [ER+]: Secondary | ICD-10-CM | POA: Diagnosis not present

## 2021-12-16 DIAGNOSIS — Z5111 Encounter for antineoplastic chemotherapy: Secondary | ICD-10-CM | POA: Diagnosis not present

## 2021-12-16 DIAGNOSIS — C50212 Malignant neoplasm of upper-inner quadrant of left female breast: Secondary | ICD-10-CM | POA: Diagnosis not present

## 2022-01-13 DIAGNOSIS — Z79899 Other long term (current) drug therapy: Secondary | ICD-10-CM | POA: Diagnosis not present

## 2022-01-13 DIAGNOSIS — C50912 Malignant neoplasm of unspecified site of left female breast: Secondary | ICD-10-CM | POA: Diagnosis not present

## 2022-01-13 DIAGNOSIS — C50212 Malignant neoplasm of upper-inner quadrant of left female breast: Secondary | ICD-10-CM | POA: Diagnosis not present

## 2022-01-13 DIAGNOSIS — Z7982 Long term (current) use of aspirin: Secondary | ICD-10-CM | POA: Diagnosis not present

## 2022-01-13 DIAGNOSIS — Z170 Estrogen receptor positive status [ER+]: Secondary | ICD-10-CM | POA: Diagnosis not present

## 2022-01-13 DIAGNOSIS — Z79818 Long term (current) use of other agents affecting estrogen receptors and estrogen levels: Secondary | ICD-10-CM | POA: Diagnosis not present

## 2022-01-13 DIAGNOSIS — Z5111 Encounter for antineoplastic chemotherapy: Secondary | ICD-10-CM | POA: Diagnosis not present

## 2022-01-13 DIAGNOSIS — Z86711 Personal history of pulmonary embolism: Secondary | ICD-10-CM | POA: Diagnosis not present

## 2022-02-10 DIAGNOSIS — Z170 Estrogen receptor positive status [ER+]: Secondary | ICD-10-CM | POA: Diagnosis not present

## 2022-02-10 DIAGNOSIS — Z5111 Encounter for antineoplastic chemotherapy: Secondary | ICD-10-CM | POA: Diagnosis not present

## 2022-02-10 DIAGNOSIS — C50212 Malignant neoplasm of upper-inner quadrant of left female breast: Secondary | ICD-10-CM | POA: Diagnosis not present

## 2022-03-03 DIAGNOSIS — C50212 Malignant neoplasm of upper-inner quadrant of left female breast: Secondary | ICD-10-CM | POA: Diagnosis not present

## 2022-03-03 DIAGNOSIS — Z170 Estrogen receptor positive status [ER+]: Secondary | ICD-10-CM | POA: Diagnosis not present

## 2022-03-03 DIAGNOSIS — R928 Other abnormal and inconclusive findings on diagnostic imaging of breast: Secondary | ICD-10-CM | POA: Diagnosis not present

## 2022-03-07 DIAGNOSIS — Z853 Personal history of malignant neoplasm of breast: Secondary | ICD-10-CM | POA: Diagnosis not present

## 2022-03-07 DIAGNOSIS — Z923 Personal history of irradiation: Secondary | ICD-10-CM | POA: Diagnosis not present

## 2022-03-07 DIAGNOSIS — C50212 Malignant neoplasm of upper-inner quadrant of left female breast: Secondary | ICD-10-CM | POA: Diagnosis not present

## 2022-03-07 DIAGNOSIS — Z86711 Personal history of pulmonary embolism: Secondary | ICD-10-CM | POA: Diagnosis not present

## 2022-03-07 DIAGNOSIS — Z170 Estrogen receptor positive status [ER+]: Secondary | ICD-10-CM | POA: Diagnosis not present

## 2022-03-08 DIAGNOSIS — F3340 Major depressive disorder, recurrent, in remission, unspecified: Secondary | ICD-10-CM | POA: Diagnosis not present

## 2022-03-08 DIAGNOSIS — R200 Anesthesia of skin: Secondary | ICD-10-CM | POA: Diagnosis not present

## 2022-03-08 DIAGNOSIS — R944 Abnormal results of kidney function studies: Secondary | ICD-10-CM | POA: Diagnosis not present

## 2022-03-08 DIAGNOSIS — M797 Fibromyalgia: Secondary | ICD-10-CM | POA: Diagnosis not present

## 2022-03-08 DIAGNOSIS — G501 Atypical facial pain: Secondary | ICD-10-CM | POA: Diagnosis not present

## 2022-03-08 DIAGNOSIS — I10 Essential (primary) hypertension: Secondary | ICD-10-CM | POA: Diagnosis not present

## 2022-03-08 DIAGNOSIS — E559 Vitamin D deficiency, unspecified: Secondary | ICD-10-CM | POA: Diagnosis not present

## 2022-03-10 DIAGNOSIS — Z5111 Encounter for antineoplastic chemotherapy: Secondary | ICD-10-CM | POA: Diagnosis not present

## 2022-03-10 DIAGNOSIS — Z170 Estrogen receptor positive status [ER+]: Secondary | ICD-10-CM | POA: Diagnosis not present

## 2022-03-10 DIAGNOSIS — C50212 Malignant neoplasm of upper-inner quadrant of left female breast: Secondary | ICD-10-CM | POA: Diagnosis not present

## 2022-03-30 DIAGNOSIS — K900 Celiac disease: Secondary | ICD-10-CM | POA: Diagnosis not present

## 2022-03-30 DIAGNOSIS — R197 Diarrhea, unspecified: Secondary | ICD-10-CM | POA: Diagnosis not present

## 2022-03-30 DIAGNOSIS — K219 Gastro-esophageal reflux disease without esophagitis: Secondary | ICD-10-CM | POA: Diagnosis not present

## 2022-04-07 DIAGNOSIS — K635 Polyp of colon: Secondary | ICD-10-CM | POA: Diagnosis not present

## 2022-04-07 DIAGNOSIS — K648 Other hemorrhoids: Secondary | ICD-10-CM | POA: Diagnosis not present

## 2022-04-07 DIAGNOSIS — I10 Essential (primary) hypertension: Secondary | ICD-10-CM | POA: Diagnosis not present

## 2022-04-07 DIAGNOSIS — D120 Benign neoplasm of cecum: Secondary | ICD-10-CM | POA: Diagnosis not present

## 2022-04-07 DIAGNOSIS — R197 Diarrhea, unspecified: Secondary | ICD-10-CM | POA: Diagnosis not present

## 2022-04-08 DIAGNOSIS — C50212 Malignant neoplasm of upper-inner quadrant of left female breast: Secondary | ICD-10-CM | POA: Diagnosis not present

## 2022-04-08 DIAGNOSIS — Z5111 Encounter for antineoplastic chemotherapy: Secondary | ICD-10-CM | POA: Diagnosis not present

## 2022-04-08 DIAGNOSIS — Z170 Estrogen receptor positive status [ER+]: Secondary | ICD-10-CM | POA: Diagnosis not present

## 2022-05-05 DIAGNOSIS — K449 Diaphragmatic hernia without obstruction or gangrene: Secondary | ICD-10-CM | POA: Diagnosis not present

## 2022-05-05 DIAGNOSIS — K900 Celiac disease: Secondary | ICD-10-CM | POA: Diagnosis not present

## 2022-05-05 DIAGNOSIS — K5289 Other specified noninfective gastroenteritis and colitis: Secondary | ICD-10-CM | POA: Diagnosis not present

## 2022-05-05 DIAGNOSIS — K529 Noninfective gastroenteritis and colitis, unspecified: Secondary | ICD-10-CM | POA: Diagnosis not present

## 2022-05-05 DIAGNOSIS — K8681 Exocrine pancreatic insufficiency: Secondary | ICD-10-CM | POA: Diagnosis not present

## 2022-05-05 DIAGNOSIS — R197 Diarrhea, unspecified: Secondary | ICD-10-CM | POA: Diagnosis not present

## 2022-05-06 DIAGNOSIS — Z170 Estrogen receptor positive status [ER+]: Secondary | ICD-10-CM | POA: Diagnosis not present

## 2022-05-06 DIAGNOSIS — Z79899 Other long term (current) drug therapy: Secondary | ICD-10-CM | POA: Diagnosis not present

## 2022-05-06 DIAGNOSIS — Z7982 Long term (current) use of aspirin: Secondary | ICD-10-CM | POA: Diagnosis not present

## 2022-05-06 DIAGNOSIS — Z923 Personal history of irradiation: Secondary | ICD-10-CM | POA: Diagnosis not present

## 2022-05-06 DIAGNOSIS — Z79890 Hormone replacement therapy (postmenopausal): Secondary | ICD-10-CM | POA: Diagnosis not present

## 2022-05-06 DIAGNOSIS — C50212 Malignant neoplasm of upper-inner quadrant of left female breast: Secondary | ICD-10-CM | POA: Diagnosis not present

## 2022-05-06 DIAGNOSIS — Z86711 Personal history of pulmonary embolism: Secondary | ICD-10-CM | POA: Diagnosis not present

## 2022-05-06 DIAGNOSIS — Z9889 Other specified postprocedural states: Secondary | ICD-10-CM | POA: Diagnosis not present

## 2022-05-06 DIAGNOSIS — C50912 Malignant neoplasm of unspecified site of left female breast: Secondary | ICD-10-CM | POA: Diagnosis not present

## 2022-05-06 DIAGNOSIS — Z90711 Acquired absence of uterus with remaining cervical stump: Secondary | ICD-10-CM | POA: Diagnosis not present

## 2022-06-06 DIAGNOSIS — C50212 Malignant neoplasm of upper-inner quadrant of left female breast: Secondary | ICD-10-CM | POA: Diagnosis not present

## 2022-06-06 DIAGNOSIS — Z5111 Encounter for antineoplastic chemotherapy: Secondary | ICD-10-CM | POA: Diagnosis not present

## 2022-06-06 DIAGNOSIS — Z170 Estrogen receptor positive status [ER+]: Secondary | ICD-10-CM | POA: Diagnosis not present

## 2022-06-27 DIAGNOSIS — S8991XA Unspecified injury of right lower leg, initial encounter: Secondary | ICD-10-CM | POA: Diagnosis not present

## 2022-06-27 DIAGNOSIS — M25461 Effusion, right knee: Secondary | ICD-10-CM | POA: Diagnosis not present

## 2022-07-07 DIAGNOSIS — Z170 Estrogen receptor positive status [ER+]: Secondary | ICD-10-CM | POA: Diagnosis not present

## 2022-07-07 DIAGNOSIS — Z5111 Encounter for antineoplastic chemotherapy: Secondary | ICD-10-CM | POA: Diagnosis not present

## 2022-07-07 DIAGNOSIS — C50212 Malignant neoplasm of upper-inner quadrant of left female breast: Secondary | ICD-10-CM | POA: Diagnosis not present

## 2022-08-08 DIAGNOSIS — Z7982 Long term (current) use of aspirin: Secondary | ICD-10-CM | POA: Diagnosis not present

## 2022-08-08 DIAGNOSIS — Z79899 Other long term (current) drug therapy: Secondary | ICD-10-CM | POA: Diagnosis not present

## 2022-08-08 DIAGNOSIS — C50912 Malignant neoplasm of unspecified site of left female breast: Secondary | ICD-10-CM | POA: Diagnosis not present

## 2022-08-08 DIAGNOSIS — C50212 Malignant neoplasm of upper-inner quadrant of left female breast: Secondary | ICD-10-CM | POA: Diagnosis not present

## 2022-08-08 DIAGNOSIS — Z86711 Personal history of pulmonary embolism: Secondary | ICD-10-CM | POA: Diagnosis not present

## 2022-08-08 DIAGNOSIS — Z5111 Encounter for antineoplastic chemotherapy: Secondary | ICD-10-CM | POA: Diagnosis not present

## 2022-08-08 DIAGNOSIS — Z170 Estrogen receptor positive status [ER+]: Secondary | ICD-10-CM | POA: Diagnosis not present

## 2022-09-06 DIAGNOSIS — C50212 Malignant neoplasm of upper-inner quadrant of left female breast: Secondary | ICD-10-CM | POA: Diagnosis not present

## 2022-09-06 DIAGNOSIS — Z170 Estrogen receptor positive status [ER+]: Secondary | ICD-10-CM | POA: Diagnosis not present

## 2022-09-07 DIAGNOSIS — Z0000 Encounter for general adult medical examination without abnormal findings: Secondary | ICD-10-CM | POA: Diagnosis not present

## 2022-09-07 DIAGNOSIS — K219 Gastro-esophageal reflux disease without esophagitis: Secondary | ICD-10-CM | POA: Diagnosis not present

## 2022-09-07 DIAGNOSIS — Z23 Encounter for immunization: Secondary | ICD-10-CM | POA: Diagnosis not present

## 2022-09-07 DIAGNOSIS — J3089 Other allergic rhinitis: Secondary | ICD-10-CM | POA: Diagnosis not present

## 2022-09-07 DIAGNOSIS — F3340 Major depressive disorder, recurrent, in remission, unspecified: Secondary | ICD-10-CM | POA: Diagnosis not present

## 2022-09-07 DIAGNOSIS — Z170 Estrogen receptor positive status [ER+]: Secondary | ICD-10-CM | POA: Diagnosis not present

## 2022-09-07 DIAGNOSIS — M810 Age-related osteoporosis without current pathological fracture: Secondary | ICD-10-CM | POA: Diagnosis not present

## 2022-09-07 DIAGNOSIS — E559 Vitamin D deficiency, unspecified: Secondary | ICD-10-CM | POA: Diagnosis not present

## 2022-09-07 DIAGNOSIS — C50212 Malignant neoplasm of upper-inner quadrant of left female breast: Secondary | ICD-10-CM | POA: Diagnosis not present

## 2022-09-07 DIAGNOSIS — G501 Atypical facial pain: Secondary | ICD-10-CM | POA: Diagnosis not present

## 2022-09-07 DIAGNOSIS — I10 Essential (primary) hypertension: Secondary | ICD-10-CM | POA: Diagnosis not present

## 2022-09-07 DIAGNOSIS — R053 Chronic cough: Secondary | ICD-10-CM | POA: Diagnosis not present

## 2022-09-07 DIAGNOSIS — M797 Fibromyalgia: Secondary | ICD-10-CM | POA: Diagnosis not present

## 2022-09-07 DIAGNOSIS — Z79899 Other long term (current) drug therapy: Secondary | ICD-10-CM | POA: Diagnosis not present

## 2022-09-07 DIAGNOSIS — R200 Anesthesia of skin: Secondary | ICD-10-CM | POA: Diagnosis not present

## 2022-09-07 DIAGNOSIS — R7989 Other specified abnormal findings of blood chemistry: Secondary | ICD-10-CM | POA: Diagnosis not present

## 2022-09-08 DIAGNOSIS — C50212 Malignant neoplasm of upper-inner quadrant of left female breast: Secondary | ICD-10-CM | POA: Diagnosis not present

## 2022-09-08 DIAGNOSIS — Z5111 Encounter for antineoplastic chemotherapy: Secondary | ICD-10-CM | POA: Diagnosis not present

## 2022-09-08 DIAGNOSIS — Z170 Estrogen receptor positive status [ER+]: Secondary | ICD-10-CM | POA: Diagnosis not present

## 2022-09-12 DIAGNOSIS — Z9889 Other specified postprocedural states: Secondary | ICD-10-CM | POA: Diagnosis not present

## 2022-09-12 DIAGNOSIS — Z170 Estrogen receptor positive status [ER+]: Secondary | ICD-10-CM | POA: Diagnosis not present

## 2022-09-12 DIAGNOSIS — Z853 Personal history of malignant neoplasm of breast: Secondary | ICD-10-CM | POA: Diagnosis not present

## 2022-09-12 DIAGNOSIS — C50212 Malignant neoplasm of upper-inner quadrant of left female breast: Secondary | ICD-10-CM | POA: Diagnosis not present

## 2022-09-12 DIAGNOSIS — Z08 Encounter for follow-up examination after completed treatment for malignant neoplasm: Secondary | ICD-10-CM | POA: Diagnosis not present

## 2022-09-12 DIAGNOSIS — Z923 Personal history of irradiation: Secondary | ICD-10-CM | POA: Diagnosis not present

## 2022-09-14 DIAGNOSIS — M1711 Unilateral primary osteoarthritis, right knee: Secondary | ICD-10-CM | POA: Diagnosis not present

## 2022-10-04 DIAGNOSIS — M5432 Sciatica, left side: Secondary | ICD-10-CM | POA: Diagnosis not present

## 2022-10-12 DIAGNOSIS — Z170 Estrogen receptor positive status [ER+]: Secondary | ICD-10-CM | POA: Diagnosis not present

## 2022-10-12 DIAGNOSIS — Z5111 Encounter for antineoplastic chemotherapy: Secondary | ICD-10-CM | POA: Diagnosis not present

## 2022-10-12 DIAGNOSIS — C50212 Malignant neoplasm of upper-inner quadrant of left female breast: Secondary | ICD-10-CM | POA: Diagnosis not present

## 2022-10-21 DIAGNOSIS — M5432 Sciatica, left side: Secondary | ICD-10-CM | POA: Diagnosis not present

## 2022-10-21 DIAGNOSIS — M5386 Other specified dorsopathies, lumbar region: Secondary | ICD-10-CM | POA: Diagnosis not present

## 2022-10-21 DIAGNOSIS — R198 Other specified symptoms and signs involving the digestive system and abdomen: Secondary | ICD-10-CM | POA: Diagnosis not present

## 2022-10-21 DIAGNOSIS — R29898 Other symptoms and signs involving the musculoskeletal system: Secondary | ICD-10-CM | POA: Diagnosis not present

## 2022-10-21 DIAGNOSIS — Z789 Other specified health status: Secondary | ICD-10-CM | POA: Diagnosis not present

## 2022-10-21 DIAGNOSIS — Z7409 Other reduced mobility: Secondary | ICD-10-CM | POA: Diagnosis not present

## 2022-11-04 DIAGNOSIS — Z789 Other specified health status: Secondary | ICD-10-CM | POA: Diagnosis not present

## 2022-11-04 DIAGNOSIS — R198 Other specified symptoms and signs involving the digestive system and abdomen: Secondary | ICD-10-CM | POA: Diagnosis not present

## 2022-11-04 DIAGNOSIS — Z7409 Other reduced mobility: Secondary | ICD-10-CM | POA: Diagnosis not present

## 2022-11-04 DIAGNOSIS — M5432 Sciatica, left side: Secondary | ICD-10-CM | POA: Diagnosis not present

## 2022-11-04 DIAGNOSIS — M5386 Other specified dorsopathies, lumbar region: Secondary | ICD-10-CM | POA: Diagnosis not present

## 2022-11-04 DIAGNOSIS — R29898 Other symptoms and signs involving the musculoskeletal system: Secondary | ICD-10-CM | POA: Diagnosis not present

## 2022-11-10 DIAGNOSIS — Z789 Other specified health status: Secondary | ICD-10-CM | POA: Diagnosis not present

## 2022-11-10 DIAGNOSIS — Z7409 Other reduced mobility: Secondary | ICD-10-CM | POA: Diagnosis not present

## 2022-11-10 DIAGNOSIS — M5386 Other specified dorsopathies, lumbar region: Secondary | ICD-10-CM | POA: Diagnosis not present

## 2022-11-10 DIAGNOSIS — R29898 Other symptoms and signs involving the musculoskeletal system: Secondary | ICD-10-CM | POA: Diagnosis not present

## 2022-11-10 DIAGNOSIS — M5432 Sciatica, left side: Secondary | ICD-10-CM | POA: Diagnosis not present

## 2022-11-10 DIAGNOSIS — R198 Other specified symptoms and signs involving the digestive system and abdomen: Secondary | ICD-10-CM | POA: Diagnosis not present

## 2022-12-05 DIAGNOSIS — Z7901 Long term (current) use of anticoagulants: Secondary | ICD-10-CM | POA: Diagnosis not present

## 2022-12-05 DIAGNOSIS — Z170 Estrogen receptor positive status [ER+]: Secondary | ICD-10-CM | POA: Diagnosis not present

## 2022-12-05 DIAGNOSIS — C50212 Malignant neoplasm of upper-inner quadrant of left female breast: Secondary | ICD-10-CM | POA: Diagnosis not present

## 2022-12-05 DIAGNOSIS — Z79899 Other long term (current) drug therapy: Secondary | ICD-10-CM | POA: Diagnosis not present

## 2022-12-05 DIAGNOSIS — Z86711 Personal history of pulmonary embolism: Secondary | ICD-10-CM | POA: Diagnosis not present

## 2022-12-05 DIAGNOSIS — C50912 Malignant neoplasm of unspecified site of left female breast: Secondary | ICD-10-CM | POA: Diagnosis not present

## 2022-12-05 DIAGNOSIS — Z5111 Encounter for antineoplastic chemotherapy: Secondary | ICD-10-CM | POA: Diagnosis not present

## 2022-12-05 DIAGNOSIS — Z7982 Long term (current) use of aspirin: Secondary | ICD-10-CM | POA: Diagnosis not present

## 2023-01-02 DIAGNOSIS — Z170 Estrogen receptor positive status [ER+]: Secondary | ICD-10-CM | POA: Diagnosis not present

## 2023-01-02 DIAGNOSIS — C50212 Malignant neoplasm of upper-inner quadrant of left female breast: Secondary | ICD-10-CM | POA: Diagnosis not present

## 2023-01-31 DIAGNOSIS — Z170 Estrogen receptor positive status [ER+]: Secondary | ICD-10-CM | POA: Diagnosis not present

## 2023-01-31 DIAGNOSIS — C50212 Malignant neoplasm of upper-inner quadrant of left female breast: Secondary | ICD-10-CM | POA: Diagnosis not present

## 2023-03-03 DIAGNOSIS — Z86711 Personal history of pulmonary embolism: Secondary | ICD-10-CM | POA: Diagnosis not present

## 2023-03-03 DIAGNOSIS — Z170 Estrogen receptor positive status [ER+]: Secondary | ICD-10-CM | POA: Diagnosis not present

## 2023-03-03 DIAGNOSIS — E876 Hypokalemia: Secondary | ICD-10-CM | POA: Diagnosis not present

## 2023-03-03 DIAGNOSIS — C50212 Malignant neoplasm of upper-inner quadrant of left female breast: Secondary | ICD-10-CM | POA: Diagnosis not present

## 2023-03-03 DIAGNOSIS — Z5111 Encounter for antineoplastic chemotherapy: Secondary | ICD-10-CM | POA: Diagnosis not present

## 2023-03-03 DIAGNOSIS — Z853 Personal history of malignant neoplasm of breast: Secondary | ICD-10-CM | POA: Diagnosis not present

## 2023-03-10 DIAGNOSIS — I10 Essential (primary) hypertension: Secondary | ICD-10-CM | POA: Diagnosis not present

## 2023-03-10 DIAGNOSIS — R200 Anesthesia of skin: Secondary | ICD-10-CM | POA: Diagnosis not present

## 2023-03-10 DIAGNOSIS — Z79899 Other long term (current) drug therapy: Secondary | ICD-10-CM | POA: Diagnosis not present

## 2023-03-10 DIAGNOSIS — M797 Fibromyalgia: Secondary | ICD-10-CM | POA: Diagnosis not present

## 2023-03-10 DIAGNOSIS — E876 Hypokalemia: Secondary | ICD-10-CM | POA: Diagnosis not present

## 2023-03-10 DIAGNOSIS — F3340 Major depressive disorder, recurrent, in remission, unspecified: Secondary | ICD-10-CM | POA: Diagnosis not present

## 2023-03-10 DIAGNOSIS — E559 Vitamin D deficiency, unspecified: Secondary | ICD-10-CM | POA: Diagnosis not present

## 2023-03-10 DIAGNOSIS — Z634 Disappearance and death of family member: Secondary | ICD-10-CM | POA: Diagnosis not present

## 2023-03-10 DIAGNOSIS — G501 Atypical facial pain: Secondary | ICD-10-CM | POA: Diagnosis not present

## 2023-03-31 DIAGNOSIS — Z5111 Encounter for antineoplastic chemotherapy: Secondary | ICD-10-CM | POA: Diagnosis not present

## 2023-03-31 DIAGNOSIS — Z853 Personal history of malignant neoplasm of breast: Secondary | ICD-10-CM | POA: Diagnosis not present

## 2023-03-31 DIAGNOSIS — Z170 Estrogen receptor positive status [ER+]: Secondary | ICD-10-CM | POA: Diagnosis not present

## 2023-03-31 DIAGNOSIS — C50212 Malignant neoplasm of upper-inner quadrant of left female breast: Secondary | ICD-10-CM | POA: Diagnosis not present

## 2023-03-31 DIAGNOSIS — Z86711 Personal history of pulmonary embolism: Secondary | ICD-10-CM | POA: Diagnosis not present

## 2023-03-31 DIAGNOSIS — E876 Hypokalemia: Secondary | ICD-10-CM | POA: Diagnosis not present

## 2023-04-25 DIAGNOSIS — K529 Noninfective gastroenteritis and colitis, unspecified: Secondary | ICD-10-CM | POA: Diagnosis not present

## 2023-04-25 DIAGNOSIS — K900 Celiac disease: Secondary | ICD-10-CM | POA: Diagnosis not present

## 2023-05-01 DIAGNOSIS — K529 Noninfective gastroenteritis and colitis, unspecified: Secondary | ICD-10-CM | POA: Diagnosis not present

## 2023-05-01 DIAGNOSIS — Z853 Personal history of malignant neoplasm of breast: Secondary | ICD-10-CM | POA: Diagnosis not present

## 2023-05-01 DIAGNOSIS — Z170 Estrogen receptor positive status [ER+]: Secondary | ICD-10-CM | POA: Diagnosis not present

## 2023-05-01 DIAGNOSIS — C50212 Malignant neoplasm of upper-inner quadrant of left female breast: Secondary | ICD-10-CM | POA: Diagnosis not present

## 2023-05-01 DIAGNOSIS — Z86711 Personal history of pulmonary embolism: Secondary | ICD-10-CM | POA: Diagnosis not present

## 2023-05-01 DIAGNOSIS — K900 Celiac disease: Secondary | ICD-10-CM | POA: Diagnosis not present

## 2023-05-01 DIAGNOSIS — Z5111 Encounter for antineoplastic chemotherapy: Secondary | ICD-10-CM | POA: Diagnosis not present

## 2023-05-29 DIAGNOSIS — Z86711 Personal history of pulmonary embolism: Secondary | ICD-10-CM | POA: Diagnosis not present

## 2023-05-29 DIAGNOSIS — C50212 Malignant neoplasm of upper-inner quadrant of left female breast: Secondary | ICD-10-CM | POA: Diagnosis not present

## 2023-05-29 DIAGNOSIS — Z853 Personal history of malignant neoplasm of breast: Secondary | ICD-10-CM | POA: Diagnosis not present

## 2023-05-29 DIAGNOSIS — Z170 Estrogen receptor positive status [ER+]: Secondary | ICD-10-CM | POA: Diagnosis not present

## 2023-05-29 DIAGNOSIS — Z5111 Encounter for antineoplastic chemotherapy: Secondary | ICD-10-CM | POA: Diagnosis not present

## 2023-06-26 DIAGNOSIS — Z170 Estrogen receptor positive status [ER+]: Secondary | ICD-10-CM | POA: Diagnosis not present

## 2023-06-26 DIAGNOSIS — C50212 Malignant neoplasm of upper-inner quadrant of left female breast: Secondary | ICD-10-CM | POA: Diagnosis not present

## 2023-06-26 DIAGNOSIS — Z79818 Long term (current) use of other agents affecting estrogen receptors and estrogen levels: Secondary | ICD-10-CM | POA: Diagnosis not present

## 2023-07-24 DIAGNOSIS — C50212 Malignant neoplasm of upper-inner quadrant of left female breast: Secondary | ICD-10-CM | POA: Diagnosis not present

## 2023-07-24 DIAGNOSIS — Z170 Estrogen receptor positive status [ER+]: Secondary | ICD-10-CM | POA: Diagnosis not present

## 2023-08-03 DIAGNOSIS — M79652 Pain in left thigh: Secondary | ICD-10-CM | POA: Diagnosis not present

## 2023-08-03 DIAGNOSIS — M25552 Pain in left hip: Secondary | ICD-10-CM | POA: Diagnosis not present

## 2023-08-03 DIAGNOSIS — W1830XA Fall on same level, unspecified, initial encounter: Secondary | ICD-10-CM | POA: Diagnosis not present

## 2023-08-21 DIAGNOSIS — Z853 Personal history of malignant neoplasm of breast: Secondary | ICD-10-CM | POA: Diagnosis not present

## 2023-08-21 DIAGNOSIS — Z86711 Personal history of pulmonary embolism: Secondary | ICD-10-CM | POA: Diagnosis not present

## 2023-08-21 DIAGNOSIS — C50212 Malignant neoplasm of upper-inner quadrant of left female breast: Secondary | ICD-10-CM | POA: Diagnosis not present

## 2023-08-21 DIAGNOSIS — Z5111 Encounter for antineoplastic chemotherapy: Secondary | ICD-10-CM | POA: Diagnosis not present

## 2023-08-21 DIAGNOSIS — Z170 Estrogen receptor positive status [ER+]: Secondary | ICD-10-CM | POA: Diagnosis not present

## 2023-09-08 DIAGNOSIS — M810 Age-related osteoporosis without current pathological fracture: Secondary | ICD-10-CM | POA: Diagnosis not present

## 2023-09-08 DIAGNOSIS — E559 Vitamin D deficiency, unspecified: Secondary | ICD-10-CM | POA: Diagnosis not present

## 2023-09-08 DIAGNOSIS — Z0000 Encounter for general adult medical examination without abnormal findings: Secondary | ICD-10-CM | POA: Diagnosis not present

## 2023-09-08 DIAGNOSIS — K219 Gastro-esophageal reflux disease without esophagitis: Secondary | ICD-10-CM | POA: Diagnosis not present

## 2023-09-11 DIAGNOSIS — I10 Essential (primary) hypertension: Secondary | ICD-10-CM | POA: Diagnosis not present

## 2023-09-11 DIAGNOSIS — C50212 Malignant neoplasm of upper-inner quadrant of left female breast: Secondary | ICD-10-CM | POA: Diagnosis not present

## 2023-09-11 DIAGNOSIS — R1310 Dysphagia, unspecified: Secondary | ICD-10-CM | POA: Diagnosis not present

## 2023-09-11 DIAGNOSIS — Z86711 Personal history of pulmonary embolism: Secondary | ICD-10-CM | POA: Diagnosis not present

## 2023-09-11 DIAGNOSIS — Z23 Encounter for immunization: Secondary | ICD-10-CM | POA: Diagnosis not present

## 2023-09-11 DIAGNOSIS — R053 Chronic cough: Secondary | ICD-10-CM | POA: Diagnosis not present

## 2023-09-11 DIAGNOSIS — K219 Gastro-esophageal reflux disease without esophagitis: Secondary | ICD-10-CM | POA: Diagnosis not present

## 2023-09-11 DIAGNOSIS — M810 Age-related osteoporosis without current pathological fracture: Secondary | ICD-10-CM | POA: Diagnosis not present

## 2023-09-11 DIAGNOSIS — Z0000 Encounter for general adult medical examination without abnormal findings: Secondary | ICD-10-CM | POA: Diagnosis not present

## 2023-09-11 DIAGNOSIS — G501 Atypical facial pain: Secondary | ICD-10-CM | POA: Diagnosis not present

## 2023-09-11 DIAGNOSIS — M5432 Sciatica, left side: Secondary | ICD-10-CM | POA: Diagnosis not present

## 2023-09-11 DIAGNOSIS — J3089 Other allergic rhinitis: Secondary | ICD-10-CM | POA: Diagnosis not present

## 2023-09-11 DIAGNOSIS — F3340 Major depressive disorder, recurrent, in remission, unspecified: Secondary | ICD-10-CM | POA: Diagnosis not present

## 2023-09-11 DIAGNOSIS — M797 Fibromyalgia: Secondary | ICD-10-CM | POA: Diagnosis not present

## 2023-09-18 DIAGNOSIS — Z170 Estrogen receptor positive status [ER+]: Secondary | ICD-10-CM | POA: Diagnosis not present

## 2023-09-18 DIAGNOSIS — C50212 Malignant neoplasm of upper-inner quadrant of left female breast: Secondary | ICD-10-CM | POA: Diagnosis not present

## 2023-09-22 DIAGNOSIS — M810 Age-related osteoporosis without current pathological fracture: Secondary | ICD-10-CM | POA: Diagnosis not present

## 2023-09-22 DIAGNOSIS — Z780 Asymptomatic menopausal state: Secondary | ICD-10-CM | POA: Diagnosis not present

## 2023-10-04 DIAGNOSIS — Z170 Estrogen receptor positive status [ER+]: Secondary | ICD-10-CM | POA: Diagnosis not present

## 2023-10-04 DIAGNOSIS — R92323 Mammographic fibroglandular density, bilateral breasts: Secondary | ICD-10-CM | POA: Diagnosis not present

## 2023-10-04 DIAGNOSIS — R928 Other abnormal and inconclusive findings on diagnostic imaging of breast: Secondary | ICD-10-CM | POA: Diagnosis not present

## 2023-10-04 DIAGNOSIS — C50212 Malignant neoplasm of upper-inner quadrant of left female breast: Secondary | ICD-10-CM | POA: Diagnosis not present

## 2023-10-16 DIAGNOSIS — Z1231 Encounter for screening mammogram for malignant neoplasm of breast: Secondary | ICD-10-CM | POA: Diagnosis not present

## 2023-10-16 DIAGNOSIS — Z170 Estrogen receptor positive status [ER+]: Secondary | ICD-10-CM | POA: Diagnosis not present

## 2023-10-16 DIAGNOSIS — C50212 Malignant neoplasm of upper-inner quadrant of left female breast: Secondary | ICD-10-CM | POA: Diagnosis not present

## 2023-10-16 DIAGNOSIS — N6312 Unspecified lump in the right breast, upper inner quadrant: Secondary | ICD-10-CM | POA: Diagnosis not present

## 2023-11-13 DIAGNOSIS — R92321 Mammographic fibroglandular density, right breast: Secondary | ICD-10-CM | POA: Diagnosis not present

## 2023-11-13 DIAGNOSIS — Z170 Estrogen receptor positive status [ER+]: Secondary | ICD-10-CM | POA: Diagnosis not present

## 2023-11-13 DIAGNOSIS — Z853 Personal history of malignant neoplasm of breast: Secondary | ICD-10-CM | POA: Diagnosis not present

## 2023-11-13 DIAGNOSIS — N6312 Unspecified lump in the right breast, upper inner quadrant: Secondary | ICD-10-CM | POA: Diagnosis not present

## 2023-11-24 DIAGNOSIS — Z170 Estrogen receptor positive status [ER+]: Secondary | ICD-10-CM | POA: Diagnosis not present

## 2023-11-24 DIAGNOSIS — C50212 Malignant neoplasm of upper-inner quadrant of left female breast: Secondary | ICD-10-CM | POA: Diagnosis not present

## 2023-11-24 DIAGNOSIS — Z853 Personal history of malignant neoplasm of breast: Secondary | ICD-10-CM | POA: Diagnosis not present

## 2023-11-24 DIAGNOSIS — Z86711 Personal history of pulmonary embolism: Secondary | ICD-10-CM | POA: Diagnosis not present

## 2023-11-24 DIAGNOSIS — Z5111 Encounter for antineoplastic chemotherapy: Secondary | ICD-10-CM | POA: Diagnosis not present

## 2023-12-14 DIAGNOSIS — R1310 Dysphagia, unspecified: Secondary | ICD-10-CM | POA: Diagnosis not present

## 2023-12-14 DIAGNOSIS — Z79899 Other long term (current) drug therapy: Secondary | ICD-10-CM | POA: Diagnosis not present

## 2023-12-14 DIAGNOSIS — K219 Gastro-esophageal reflux disease without esophagitis: Secondary | ICD-10-CM | POA: Diagnosis not present

## 2023-12-22 DIAGNOSIS — Z170 Estrogen receptor positive status [ER+]: Secondary | ICD-10-CM | POA: Diagnosis not present

## 2023-12-22 DIAGNOSIS — C50212 Malignant neoplasm of upper-inner quadrant of left female breast: Secondary | ICD-10-CM | POA: Diagnosis not present

## 2024-01-19 DIAGNOSIS — Z170 Estrogen receptor positive status [ER+]: Secondary | ICD-10-CM | POA: Diagnosis not present

## 2024-01-19 DIAGNOSIS — C50212 Malignant neoplasm of upper-inner quadrant of left female breast: Secondary | ICD-10-CM | POA: Diagnosis not present

## 2024-01-23 DIAGNOSIS — R42 Dizziness and giddiness: Secondary | ICD-10-CM | POA: Diagnosis not present

## 2024-01-24 DIAGNOSIS — R42 Dizziness and giddiness: Secondary | ICD-10-CM | POA: Diagnosis not present

## 2024-02-19 DIAGNOSIS — Z86711 Personal history of pulmonary embolism: Secondary | ICD-10-CM | POA: Diagnosis not present

## 2024-02-19 DIAGNOSIS — Z5111 Encounter for antineoplastic chemotherapy: Secondary | ICD-10-CM | POA: Diagnosis not present

## 2024-02-19 DIAGNOSIS — Z170 Estrogen receptor positive status [ER+]: Secondary | ICD-10-CM | POA: Diagnosis not present

## 2024-02-19 DIAGNOSIS — C50212 Malignant neoplasm of upper-inner quadrant of left female breast: Secondary | ICD-10-CM | POA: Diagnosis not present

## 2024-02-29 DIAGNOSIS — R42 Dizziness and giddiness: Secondary | ICD-10-CM | POA: Diagnosis not present

## 2024-03-08 DIAGNOSIS — H8120 Vestibular neuronitis, unspecified ear: Secondary | ICD-10-CM | POA: Diagnosis not present

## 2024-03-08 DIAGNOSIS — R42 Dizziness and giddiness: Secondary | ICD-10-CM | POA: Diagnosis not present

## 2024-03-11 DIAGNOSIS — K219 Gastro-esophageal reflux disease without esophagitis: Secondary | ICD-10-CM | POA: Diagnosis not present

## 2024-03-11 DIAGNOSIS — R1310 Dysphagia, unspecified: Secondary | ICD-10-CM | POA: Diagnosis not present

## 2024-03-11 DIAGNOSIS — I10 Essential (primary) hypertension: Secondary | ICD-10-CM | POA: Diagnosis not present

## 2024-03-11 DIAGNOSIS — M797 Fibromyalgia: Secondary | ICD-10-CM | POA: Diagnosis not present

## 2024-03-11 DIAGNOSIS — F3340 Major depressive disorder, recurrent, in remission, unspecified: Secondary | ICD-10-CM | POA: Diagnosis not present

## 2024-03-11 DIAGNOSIS — G501 Atypical facial pain: Secondary | ICD-10-CM | POA: Diagnosis not present

## 2024-03-11 DIAGNOSIS — E785 Hyperlipidemia, unspecified: Secondary | ICD-10-CM | POA: Diagnosis not present

## 2024-03-11 DIAGNOSIS — M7662 Achilles tendinitis, left leg: Secondary | ICD-10-CM | POA: Diagnosis not present

## 2024-03-11 DIAGNOSIS — R252 Cramp and spasm: Secondary | ICD-10-CM | POA: Diagnosis not present

## 2024-03-11 DIAGNOSIS — H8120 Vestibular neuronitis, unspecified ear: Secondary | ICD-10-CM | POA: Diagnosis not present

## 2024-03-11 DIAGNOSIS — E559 Vitamin D deficiency, unspecified: Secondary | ICD-10-CM | POA: Diagnosis not present

## 2024-03-15 DIAGNOSIS — R42 Dizziness and giddiness: Secondary | ICD-10-CM | POA: Diagnosis not present

## 2024-03-15 DIAGNOSIS — H8120 Vestibular neuronitis, unspecified ear: Secondary | ICD-10-CM | POA: Diagnosis not present

## 2024-03-18 DIAGNOSIS — Z170 Estrogen receptor positive status [ER+]: Secondary | ICD-10-CM | POA: Diagnosis not present

## 2024-03-18 DIAGNOSIS — C50212 Malignant neoplasm of upper-inner quadrant of left female breast: Secondary | ICD-10-CM | POA: Diagnosis not present

## 2024-03-19 DIAGNOSIS — H8120 Vestibular neuronitis, unspecified ear: Secondary | ICD-10-CM | POA: Diagnosis not present

## 2024-03-19 DIAGNOSIS — R42 Dizziness and giddiness: Secondary | ICD-10-CM | POA: Diagnosis not present

## 2024-04-15 DIAGNOSIS — C50212 Malignant neoplasm of upper-inner quadrant of left female breast: Secondary | ICD-10-CM | POA: Diagnosis not present

## 2024-04-15 DIAGNOSIS — Z0189 Encounter for other specified special examinations: Secondary | ICD-10-CM | POA: Diagnosis not present

## 2024-04-15 DIAGNOSIS — Z170 Estrogen receptor positive status [ER+]: Secondary | ICD-10-CM | POA: Diagnosis not present

## 2024-05-01 DIAGNOSIS — M7662 Achilles tendinitis, left leg: Secondary | ICD-10-CM | POA: Diagnosis not present

## 2024-05-16 DIAGNOSIS — Z86711 Personal history of pulmonary embolism: Secondary | ICD-10-CM | POA: Diagnosis not present

## 2024-05-16 DIAGNOSIS — C50212 Malignant neoplasm of upper-inner quadrant of left female breast: Secondary | ICD-10-CM | POA: Diagnosis not present

## 2024-05-16 DIAGNOSIS — Z170 Estrogen receptor positive status [ER+]: Secondary | ICD-10-CM | POA: Diagnosis not present

## 2024-05-16 DIAGNOSIS — Z5111 Encounter for antineoplastic chemotherapy: Secondary | ICD-10-CM | POA: Diagnosis not present

## 2024-06-03 DIAGNOSIS — M7989 Other specified soft tissue disorders: Secondary | ICD-10-CM | POA: Diagnosis not present

## 2024-06-03 DIAGNOSIS — M7662 Achilles tendinitis, left leg: Secondary | ICD-10-CM | POA: Diagnosis not present
